# Patient Record
Sex: Female | Born: 1950
Health system: Southern US, Community
[De-identification: ages and names within clinical notes are randomized; demographics above are authoritative.]

## PROBLEM LIST (undated history)

## (undated) DIAGNOSIS — I10 Essential (primary) hypertension: Secondary | ICD-10-CM

## (undated) DIAGNOSIS — Q2543 Congenital aneurysm of aorta: Secondary | ICD-10-CM

## (undated) DIAGNOSIS — I6381 Other cerebral infarction due to occlusion or stenosis of small artery: Secondary | ICD-10-CM

## (undated) DIAGNOSIS — M4800 Spinal stenosis, site unspecified: Secondary | ICD-10-CM

## (undated) DIAGNOSIS — I7121 Aneurysm of the ascending aorta, without rupture: Secondary | ICD-10-CM

## (undated) DIAGNOSIS — G459 Transient cerebral ischemic attack, unspecified: Secondary | ICD-10-CM

## (undated) DIAGNOSIS — E559 Vitamin D deficiency, unspecified: Secondary | ICD-10-CM

## (undated) DIAGNOSIS — E119 Type 2 diabetes mellitus without complications: Secondary | ICD-10-CM

## (undated) DIAGNOSIS — E785 Hyperlipidemia, unspecified: Secondary | ICD-10-CM

## (undated) HISTORY — PX: ABDOMINAL HYSTERECTOMY: SHX81

## (undated) HISTORY — DX: Spinal stenosis, site unspecified: M48.00

## (undated) HISTORY — PX: SPINE SURGERY: SHX786

## (undated) HISTORY — PX: APPENDECTOMY: SHX54

## (undated) HISTORY — DX: Type 2 diabetes mellitus without complications: E11.9

## (undated) HISTORY — DX: Essential (primary) hypertension: I10

## (undated) HISTORY — DX: Vitamin D deficiency, unspecified: E55.9

## (undated) HISTORY — DX: Hyperlipidemia, unspecified: E78.5

## (undated) HISTORY — PX: REPLACEMENT TOTAL KNEE: SUR1224

## (undated) HISTORY — DX: Other cerebral infarction due to occlusion or stenosis of small artery: I63.81

---

## 2014-09-19 DIAGNOSIS — Z0000 Encounter for general adult medical examination without abnormal findings: Secondary | ICD-10-CM | POA: Diagnosis not present

## 2014-09-19 DIAGNOSIS — E1169 Type 2 diabetes mellitus with other specified complication: Secondary | ICD-10-CM | POA: Diagnosis not present

## 2014-09-19 DIAGNOSIS — E782 Mixed hyperlipidemia: Secondary | ICD-10-CM | POA: Diagnosis not present

## 2014-09-25 DIAGNOSIS — M25552 Pain in left hip: Secondary | ICD-10-CM | POA: Diagnosis not present

## 2014-09-25 DIAGNOSIS — M5136 Other intervertebral disc degeneration, lumbar region: Secondary | ICD-10-CM | POA: Diagnosis not present

## 2014-09-25 DIAGNOSIS — M545 Low back pain: Secondary | ICD-10-CM | POA: Diagnosis not present

## 2014-09-25 DIAGNOSIS — E782 Mixed hyperlipidemia: Secondary | ICD-10-CM | POA: Diagnosis not present

## 2014-09-25 DIAGNOSIS — M47816 Spondylosis without myelopathy or radiculopathy, lumbar region: Secondary | ICD-10-CM | POA: Diagnosis not present

## 2014-09-25 DIAGNOSIS — Z1389 Encounter for screening for other disorder: Secondary | ICD-10-CM | POA: Diagnosis not present

## 2014-09-25 DIAGNOSIS — R739 Hyperglycemia, unspecified: Secondary | ICD-10-CM | POA: Diagnosis not present

## 2014-09-25 DIAGNOSIS — Z0000 Encounter for general adult medical examination without abnormal findings: Secondary | ICD-10-CM | POA: Diagnosis not present

## 2014-09-25 DIAGNOSIS — Z23 Encounter for immunization: Secondary | ICD-10-CM | POA: Diagnosis not present

## 2014-09-25 DIAGNOSIS — R911 Solitary pulmonary nodule: Secondary | ICD-10-CM | POA: Diagnosis not present

## 2014-09-25 DIAGNOSIS — M25551 Pain in right hip: Secondary | ICD-10-CM | POA: Diagnosis not present

## 2014-10-02 DIAGNOSIS — K8080 Other cholelithiasis without obstruction: Secondary | ICD-10-CM | POA: Diagnosis not present

## 2014-10-02 DIAGNOSIS — R918 Other nonspecific abnormal finding of lung field: Secondary | ICD-10-CM | POA: Diagnosis not present

## 2014-10-02 DIAGNOSIS — R911 Solitary pulmonary nodule: Secondary | ICD-10-CM | POA: Diagnosis not present

## 2014-10-02 DIAGNOSIS — Z1231 Encounter for screening mammogram for malignant neoplasm of breast: Secondary | ICD-10-CM | POA: Diagnosis not present

## 2014-10-17 DIAGNOSIS — H538 Other visual disturbances: Secondary | ICD-10-CM | POA: Diagnosis not present

## 2014-10-17 DIAGNOSIS — H2513 Age-related nuclear cataract, bilateral: Secondary | ICD-10-CM | POA: Diagnosis not present

## 2014-11-06 DIAGNOSIS — H401131 Primary open-angle glaucoma, bilateral, mild stage: Secondary | ICD-10-CM | POA: Diagnosis not present

## 2014-11-12 ENCOUNTER — Ambulatory Visit (HOSPITAL_COMMUNITY)
Admission: RE | Admit: 2014-11-12 | Discharge: 2014-11-12 | Disposition: A | Discharge status: Home / Self Care | Payer: Medicare Other | Source: Ambulatory Visit | Attending: Ophthalmology | Admitting: Ophthalmology

## 2014-11-12 ENCOUNTER — Encounter (HOSPITAL_COMMUNITY)
Admission: RE | Disposition: A | Discharge status: Home / Self Care | Payer: Self-pay | Source: Ambulatory Visit | Attending: Ophthalmology

## 2014-11-12 ENCOUNTER — Encounter (HOSPITAL_COMMUNITY): Payer: Self-pay | Admitting: *Deleted

## 2014-11-12 DIAGNOSIS — H401111 Primary open-angle glaucoma, right eye, mild stage: Secondary | ICD-10-CM | POA: Diagnosis not present

## 2014-11-12 DIAGNOSIS — H4089 Other specified glaucoma: Secondary | ICD-10-CM | POA: Diagnosis not present

## 2014-11-12 HISTORY — PX: SLT LASER APPLICATION: SHX6099

## 2014-11-12 SURGERY — SLT LASER APPLICATION
Anesthesia: LOCAL | Laterality: Right

## 2014-11-12 MED ORDER — TETRACAINE HCL 0.5 % OP SOLN
1.0000 [drp] | Freq: Once | OPHTHALMIC | Status: AC
  Administered 2014-11-12: 1 [drp] via OPHTHALMIC

## 2014-11-12 MED ORDER — TETRACAINE HCL 0.5 % OP SOLN
OPHTHALMIC | Status: AC
  Filled 2014-11-12: qty 2

## 2014-11-12 MED ORDER — PILOCARPINE HCL 1 % OP SOLN
2.0000 [drp] | Freq: Once | OPHTHALMIC | Status: AC
  Administered 2014-11-12: 2 [drp] via OPHTHALMIC

## 2014-11-12 MED ORDER — PILOCARPINE HCL 1 % OP SOLN
OPHTHALMIC | Status: AC
  Filled 2014-11-12: qty 15

## 2014-11-12 MED ORDER — APRACLONIDINE HCL 1 % OP SOLN
OPHTHALMIC | Status: AC
  Filled 2014-11-12: qty 0.1

## 2014-11-12 MED ORDER — APRACLONIDINE HCL 1 % OP SOLN
1.0000 [drp] | OPHTHALMIC | Status: AC
  Administered 2014-11-12 (×3): 1 [drp] via OPHTHALMIC

## 2014-11-12 NOTE — Brief Op Note (Signed)
Danielle Frey 11/12/2014  Williams Che, MD  Pre-op Diagnosis:  uncontrolled glaucoma  Post-op Diagnosis:  same  Yag laser self-test completed: Yes.    Indications:  Elevated IOP and new glaucomatous Vf changes  Procedure: SLT OD  right eye  Eye protection worn by staff:  Yes.   Laser In Use sign on door:  Yes.    Laser:  LUMENIS YAG/SLT LASER  Power Setting:  0.9 mJ/burst Anatomical site treated:  Trabecular meshwork OD Number of applications:  767 Total energy delivered: 91.8 mJ Results:  Gas bubbles observed  The patient was discharged home with instructions to continue all her current glaucoma medications in the un-operated eye, and discontinue all her current glaucoma medications, if any.  Patient was instructed to go to the office, as previously scheduled, for intraocular pressure:  Yes.    Patient verbalizes understanding of discharge instructions:  Yes.    Notes:  Gomoscopy:  Grade:   4 Open    Pigmentation:  none    Synechiae:  none    Angle ressessions : none    Other:  Pt tolerated procedure well without complications

## 2014-11-12 NOTE — Discharge Instructions (Signed)
Danielle Frey  11/12/2014     Instructions    Activity: No Restrictions.   Diet: Resume Diet you were on at home.   Pain Medication: Tylenol if Needed.   CONTACT YOUR DOCTOR IF YOU HAVE PAIN, REDNESS IN YOUR EYE, OR DECREASED VISION.   Follow-up: 12/11/2014 at 10:45 with Williams Che, MD.   Dr. Iona Hansen: [phone]     If you find that you cannot contact your physician, but feel that your signs and   Symptoms warrant a physician's attention, call the Emergency Room at   [phone] [phone].

## 2014-11-13 ENCOUNTER — Encounter (HOSPITAL_COMMUNITY): Payer: Self-pay | Admitting: Ophthalmology

## 2014-11-19 DIAGNOSIS — H401131 Primary open-angle glaucoma, bilateral, mild stage: Secondary | ICD-10-CM | POA: Diagnosis not present

## 2014-11-26 DIAGNOSIS — H401131 Primary open-angle glaucoma, bilateral, mild stage: Secondary | ICD-10-CM | POA: Diagnosis not present

## 2015-01-10 DIAGNOSIS — E1169 Type 2 diabetes mellitus with other specified complication: Secondary | ICD-10-CM | POA: Diagnosis not present

## 2015-01-10 DIAGNOSIS — E782 Mixed hyperlipidemia: Secondary | ICD-10-CM | POA: Diagnosis not present

## 2015-01-17 DIAGNOSIS — M545 Low back pain: Secondary | ICD-10-CM | POA: Diagnosis not present

## 2015-01-17 DIAGNOSIS — M25551 Pain in right hip: Secondary | ICD-10-CM | POA: Diagnosis not present

## 2015-01-17 DIAGNOSIS — R911 Solitary pulmonary nodule: Secondary | ICD-10-CM | POA: Diagnosis not present

## 2015-01-17 DIAGNOSIS — M25552 Pain in left hip: Secondary | ICD-10-CM | POA: Diagnosis not present

## 2015-01-17 DIAGNOSIS — R739 Hyperglycemia, unspecified: Secondary | ICD-10-CM | POA: Diagnosis not present

## 2015-01-17 DIAGNOSIS — E782 Mixed hyperlipidemia: Secondary | ICD-10-CM | POA: Diagnosis not present

## 2015-01-23 DIAGNOSIS — M4806 Spinal stenosis, lumbar region: Secondary | ICD-10-CM | POA: Diagnosis not present

## 2015-01-23 DIAGNOSIS — M4316 Spondylolisthesis, lumbar region: Secondary | ICD-10-CM | POA: Diagnosis not present

## 2015-01-23 DIAGNOSIS — M47814 Spondylosis without myelopathy or radiculopathy, thoracic region: Secondary | ICD-10-CM | POA: Diagnosis not present

## 2015-01-23 DIAGNOSIS — M5124 Other intervertebral disc displacement, thoracic region: Secondary | ICD-10-CM | POA: Diagnosis not present

## 2015-02-06 DIAGNOSIS — Z96651 Presence of right artificial knee joint: Secondary | ICD-10-CM | POA: Diagnosis not present

## 2015-02-06 DIAGNOSIS — Z87891 Personal history of nicotine dependence: Secondary | ICD-10-CM | POA: Diagnosis not present

## 2015-02-06 DIAGNOSIS — Z90710 Acquired absence of both cervix and uterus: Secondary | ICD-10-CM | POA: Diagnosis not present

## 2015-02-06 DIAGNOSIS — Z13820 Encounter for screening for osteoporosis: Secondary | ICD-10-CM | POA: Diagnosis not present

## 2015-02-06 DIAGNOSIS — E2839 Other primary ovarian failure: Secondary | ICD-10-CM | POA: Diagnosis not present

## 2015-02-06 DIAGNOSIS — Z79899 Other long term (current) drug therapy: Secondary | ICD-10-CM | POA: Diagnosis not present

## 2015-02-06 DIAGNOSIS — M810 Age-related osteoporosis without current pathological fracture: Secondary | ICD-10-CM | POA: Diagnosis not present

## 2015-02-06 DIAGNOSIS — M545 Low back pain: Secondary | ICD-10-CM | POA: Diagnosis not present

## 2015-03-07 DIAGNOSIS — M4317 Spondylolisthesis, lumbosacral region: Secondary | ICD-10-CM | POA: Diagnosis not present

## 2015-03-07 DIAGNOSIS — M47817 Spondylosis without myelopathy or radiculopathy, lumbosacral region: Secondary | ICD-10-CM | POA: Diagnosis not present

## 2015-03-07 DIAGNOSIS — M4806 Spinal stenosis, lumbar region: Secondary | ICD-10-CM | POA: Diagnosis not present

## 2015-03-07 DIAGNOSIS — H401131 Primary open-angle glaucoma, bilateral, mild stage: Secondary | ICD-10-CM | POA: Diagnosis not present

## 2015-03-07 DIAGNOSIS — H2512 Age-related nuclear cataract, left eye: Secondary | ICD-10-CM | POA: Diagnosis not present

## 2015-03-07 DIAGNOSIS — M5136 Other intervertebral disc degeneration, lumbar region: Secondary | ICD-10-CM | POA: Diagnosis not present

## 2015-03-07 DIAGNOSIS — Z961 Presence of intraocular lens: Secondary | ICD-10-CM | POA: Diagnosis not present

## 2015-03-28 DIAGNOSIS — M25551 Pain in right hip: Secondary | ICD-10-CM | POA: Diagnosis not present

## 2015-03-28 DIAGNOSIS — M4806 Spinal stenosis, lumbar region: Secondary | ICD-10-CM | POA: Diagnosis not present

## 2015-03-28 DIAGNOSIS — M25561 Pain in right knee: Secondary | ICD-10-CM | POA: Diagnosis not present

## 2015-03-28 DIAGNOSIS — M25552 Pain in left hip: Secondary | ICD-10-CM | POA: Diagnosis not present

## 2015-03-28 DIAGNOSIS — M5136 Other intervertebral disc degeneration, lumbar region: Secondary | ICD-10-CM | POA: Diagnosis not present

## 2015-03-28 DIAGNOSIS — Z823 Family history of stroke: Secondary | ICD-10-CM | POA: Diagnosis not present

## 2015-03-28 DIAGNOSIS — Z96651 Presence of right artificial knee joint: Secondary | ICD-10-CM | POA: Diagnosis not present

## 2015-03-28 DIAGNOSIS — Z87891 Personal history of nicotine dependence: Secondary | ICD-10-CM | POA: Diagnosis not present

## 2015-03-28 DIAGNOSIS — M47816 Spondylosis without myelopathy or radiculopathy, lumbar region: Secondary | ICD-10-CM | POA: Diagnosis not present

## 2015-03-28 DIAGNOSIS — M150 Primary generalized (osteo)arthritis: Secondary | ICD-10-CM | POA: Diagnosis not present

## 2015-03-28 DIAGNOSIS — Z8249 Family history of ischemic heart disease and other diseases of the circulatory system: Secondary | ICD-10-CM | POA: Diagnosis not present

## 2015-03-28 DIAGNOSIS — M4317 Spondylolisthesis, lumbosacral region: Secondary | ICD-10-CM | POA: Diagnosis not present

## 2015-03-28 DIAGNOSIS — Z809 Family history of malignant neoplasm, unspecified: Secondary | ICD-10-CM | POA: Diagnosis not present

## 2015-03-28 DIAGNOSIS — M47817 Spondylosis without myelopathy or radiculopathy, lumbosacral region: Secondary | ICD-10-CM | POA: Diagnosis not present

## 2015-03-28 DIAGNOSIS — Z6841 Body Mass Index (BMI) 40.0 and over, adult: Secondary | ICD-10-CM | POA: Diagnosis not present

## 2015-03-28 DIAGNOSIS — Z833 Family history of diabetes mellitus: Secondary | ICD-10-CM | POA: Diagnosis not present

## 2015-04-17 DIAGNOSIS — M5136 Other intervertebral disc degeneration, lumbar region: Secondary | ICD-10-CM | POA: Diagnosis not present

## 2015-04-17 DIAGNOSIS — M4317 Spondylolisthesis, lumbosacral region: Secondary | ICD-10-CM | POA: Diagnosis not present

## 2015-04-17 DIAGNOSIS — M150 Primary generalized (osteo)arthritis: Secondary | ICD-10-CM | POA: Diagnosis not present

## 2015-04-17 DIAGNOSIS — M47817 Spondylosis without myelopathy or radiculopathy, lumbosacral region: Secondary | ICD-10-CM | POA: Diagnosis not present

## 2015-06-27 DIAGNOSIS — H401131 Primary open-angle glaucoma, bilateral, mild stage: Secondary | ICD-10-CM | POA: Diagnosis not present

## 2016-01-06 ENCOUNTER — Ambulatory Visit (INDEPENDENT_AMBULATORY_CARE_PROVIDER_SITE_OTHER): Payer: Medicare Other | Admitting: Otolaryngology

## 2016-01-06 DIAGNOSIS — J343 Hypertrophy of nasal turbinates: Secondary | ICD-10-CM

## 2016-01-06 DIAGNOSIS — J310 Chronic rhinitis: Secondary | ICD-10-CM

## 2016-04-16 DIAGNOSIS — K432 Incisional hernia without obstruction or gangrene: Secondary | ICD-10-CM | POA: Diagnosis not present

## 2016-04-27 DIAGNOSIS — K439 Ventral hernia without obstruction or gangrene: Secondary | ICD-10-CM | POA: Diagnosis not present

## 2016-04-27 DIAGNOSIS — Z538 Procedure and treatment not carried out for other reasons: Secondary | ICD-10-CM | POA: Diagnosis not present

## 2016-04-30 DIAGNOSIS — E782 Mixed hyperlipidemia: Secondary | ICD-10-CM | POA: Diagnosis not present

## 2016-04-30 DIAGNOSIS — K429 Umbilical hernia without obstruction or gangrene: Secondary | ICD-10-CM | POA: Diagnosis not present

## 2016-04-30 DIAGNOSIS — E1169 Type 2 diabetes mellitus with other specified complication: Secondary | ICD-10-CM | POA: Diagnosis not present

## 2016-04-30 DIAGNOSIS — Z6841 Body Mass Index (BMI) 40.0 and over, adult: Secondary | ICD-10-CM | POA: Diagnosis not present

## 2016-04-30 DIAGNOSIS — G4733 Obstructive sleep apnea (adult) (pediatric): Secondary | ICD-10-CM | POA: Diagnosis not present

## 2016-05-07 DIAGNOSIS — G4733 Obstructive sleep apnea (adult) (pediatric): Secondary | ICD-10-CM | POA: Diagnosis not present

## 2016-05-15 DIAGNOSIS — Z6841 Body Mass Index (BMI) 40.0 and over, adult: Secondary | ICD-10-CM | POA: Diagnosis not present

## 2016-05-15 DIAGNOSIS — G4733 Obstructive sleep apnea (adult) (pediatric): Secondary | ICD-10-CM | POA: Diagnosis not present

## 2016-05-15 DIAGNOSIS — K429 Umbilical hernia without obstruction or gangrene: Secondary | ICD-10-CM | POA: Diagnosis not present

## 2016-05-15 DIAGNOSIS — E782 Mixed hyperlipidemia: Secondary | ICD-10-CM | POA: Diagnosis not present

## 2016-05-15 DIAGNOSIS — M545 Low back pain: Secondary | ICD-10-CM | POA: Diagnosis not present

## 2016-05-22 DIAGNOSIS — R531 Weakness: Secondary | ICD-10-CM | POA: Diagnosis not present

## 2016-05-22 DIAGNOSIS — M545 Low back pain: Secondary | ICD-10-CM | POA: Diagnosis not present

## 2016-05-22 DIAGNOSIS — M9983 Other biomechanical lesions of lumbar region: Secondary | ICD-10-CM | POA: Diagnosis not present

## 2016-05-22 DIAGNOSIS — M48061 Spinal stenosis, lumbar region without neurogenic claudication: Secondary | ICD-10-CM | POA: Diagnosis not present

## 2016-05-22 DIAGNOSIS — M47816 Spondylosis without myelopathy or radiculopathy, lumbar region: Secondary | ICD-10-CM | POA: Diagnosis not present

## 2016-06-30 DIAGNOSIS — F518 Other sleep disorders not due to a substance or known physiological condition: Secondary | ICD-10-CM | POA: Diagnosis not present

## 2016-06-30 DIAGNOSIS — E785 Hyperlipidemia, unspecified: Secondary | ICD-10-CM | POA: Diagnosis not present

## 2016-06-30 DIAGNOSIS — G4736 Sleep related hypoventilation in conditions classified elsewhere: Secondary | ICD-10-CM | POA: Diagnosis not present

## 2016-06-30 DIAGNOSIS — Z87891 Personal history of nicotine dependence: Secondary | ICD-10-CM | POA: Diagnosis not present

## 2016-09-11 DIAGNOSIS — M47816 Spondylosis without myelopathy or radiculopathy, lumbar region: Secondary | ICD-10-CM | POA: Diagnosis not present

## 2016-09-11 DIAGNOSIS — M545 Low back pain: Secondary | ICD-10-CM | POA: Diagnosis not present

## 2016-09-11 DIAGNOSIS — G894 Chronic pain syndrome: Secondary | ICD-10-CM

## 2016-09-11 DIAGNOSIS — M5136 Other intervertebral disc degeneration, lumbar region: Secondary | ICD-10-CM | POA: Diagnosis not present

## 2016-09-11 DIAGNOSIS — M5459 Other low back pain: Secondary | ICD-10-CM

## 2016-09-11 DIAGNOSIS — M48062 Spinal stenosis, lumbar region with neurogenic claudication: Secondary | ICD-10-CM | POA: Diagnosis not present

## 2016-09-11 DIAGNOSIS — M51369 Other intervertebral disc degeneration, lumbar region without mention of lumbar back pain or lower extremity pain: Secondary | ICD-10-CM

## 2016-09-11 HISTORY — DX: Other low back pain: M54.59

## 2016-09-11 HISTORY — DX: Chronic pain syndrome: G89.4

## 2016-09-11 HISTORY — DX: Other intervertebral disc degeneration, lumbar region without mention of lumbar back pain or lower extremity pain: M51.369

## 2016-09-11 HISTORY — DX: Spondylosis without myelopathy or radiculopathy, lumbar region: M47.816

## 2016-09-11 HISTORY — DX: Spinal stenosis, lumbar region with neurogenic claudication: M48.062

## 2016-09-15 DIAGNOSIS — Z0000 Encounter for general adult medical examination without abnormal findings: Secondary | ICD-10-CM | POA: Diagnosis not present

## 2016-09-15 DIAGNOSIS — Z6841 Body Mass Index (BMI) 40.0 and over, adult: Secondary | ICD-10-CM | POA: Diagnosis not present

## 2016-09-15 DIAGNOSIS — Z720 Tobacco use: Secondary | ICD-10-CM | POA: Diagnosis not present

## 2016-09-15 DIAGNOSIS — E782 Mixed hyperlipidemia: Secondary | ICD-10-CM | POA: Diagnosis not present

## 2016-09-15 DIAGNOSIS — E1169 Type 2 diabetes mellitus with other specified complication: Secondary | ICD-10-CM | POA: Diagnosis not present

## 2016-09-17 DIAGNOSIS — M5136 Other intervertebral disc degeneration, lumbar region: Secondary | ICD-10-CM | POA: Diagnosis not present

## 2016-09-21 DIAGNOSIS — E782 Mixed hyperlipidemia: Secondary | ICD-10-CM | POA: Diagnosis not present

## 2016-09-21 DIAGNOSIS — G4733 Obstructive sleep apnea (adult) (pediatric): Secondary | ICD-10-CM | POA: Diagnosis not present

## 2016-09-21 DIAGNOSIS — M545 Low back pain: Secondary | ICD-10-CM | POA: Diagnosis not present

## 2016-09-21 DIAGNOSIS — Z6841 Body Mass Index (BMI) 40.0 and over, adult: Secondary | ICD-10-CM | POA: Diagnosis not present

## 2016-09-21 DIAGNOSIS — Z23 Encounter for immunization: Secondary | ICD-10-CM | POA: Diagnosis not present

## 2016-09-21 DIAGNOSIS — K429 Umbilical hernia without obstruction or gangrene: Secondary | ICD-10-CM | POA: Diagnosis not present

## 2016-09-23 DIAGNOSIS — H2512 Age-related nuclear cataract, left eye: Secondary | ICD-10-CM | POA: Diagnosis not present

## 2016-09-23 DIAGNOSIS — H40053 Ocular hypertension, bilateral: Secondary | ICD-10-CM | POA: Diagnosis not present

## 2016-09-23 DIAGNOSIS — H40023 Open angle with borderline findings, high risk, bilateral: Secondary | ICD-10-CM | POA: Diagnosis not present

## 2016-09-23 DIAGNOSIS — Z961 Presence of intraocular lens: Secondary | ICD-10-CM | POA: Diagnosis not present

## 2016-09-25 DIAGNOSIS — M6281 Muscle weakness (generalized): Secondary | ICD-10-CM | POA: Diagnosis not present

## 2016-09-25 DIAGNOSIS — M5440 Lumbago with sciatica, unspecified side: Secondary | ICD-10-CM | POA: Diagnosis not present

## 2016-09-25 DIAGNOSIS — R262 Difficulty in walking, not elsewhere classified: Secondary | ICD-10-CM | POA: Diagnosis not present

## 2016-09-25 DIAGNOSIS — M545 Low back pain: Secondary | ICD-10-CM | POA: Diagnosis not present

## 2016-09-29 DIAGNOSIS — M6281 Muscle weakness (generalized): Secondary | ICD-10-CM | POA: Diagnosis not present

## 2016-09-29 DIAGNOSIS — R262 Difficulty in walking, not elsewhere classified: Secondary | ICD-10-CM | POA: Diagnosis not present

## 2016-09-29 DIAGNOSIS — M5440 Lumbago with sciatica, unspecified side: Secondary | ICD-10-CM | POA: Diagnosis not present

## 2016-09-29 DIAGNOSIS — M545 Low back pain: Secondary | ICD-10-CM | POA: Diagnosis not present

## 2016-10-01 DIAGNOSIS — M6281 Muscle weakness (generalized): Secondary | ICD-10-CM | POA: Diagnosis not present

## 2016-10-01 DIAGNOSIS — M545 Low back pain: Secondary | ICD-10-CM | POA: Diagnosis not present

## 2016-10-01 DIAGNOSIS — M5440 Lumbago with sciatica, unspecified side: Secondary | ICD-10-CM | POA: Diagnosis not present

## 2016-10-01 DIAGNOSIS — R262 Difficulty in walking, not elsewhere classified: Secondary | ICD-10-CM | POA: Diagnosis not present

## 2016-10-01 DIAGNOSIS — K420 Umbilical hernia with obstruction, without gangrene: Secondary | ICD-10-CM | POA: Diagnosis not present

## 2016-10-06 DIAGNOSIS — E785 Hyperlipidemia, unspecified: Secondary | ICD-10-CM | POA: Diagnosis not present

## 2016-10-06 DIAGNOSIS — Z8249 Family history of ischemic heart disease and other diseases of the circulatory system: Secondary | ICD-10-CM | POA: Diagnosis not present

## 2016-10-06 DIAGNOSIS — Z79899 Other long term (current) drug therapy: Secondary | ICD-10-CM | POA: Diagnosis not present

## 2016-10-06 DIAGNOSIS — E119 Type 2 diabetes mellitus without complications: Secondary | ICD-10-CM | POA: Diagnosis not present

## 2016-10-06 DIAGNOSIS — G4733 Obstructive sleep apnea (adult) (pediatric): Secondary | ICD-10-CM | POA: Diagnosis not present

## 2016-10-06 DIAGNOSIS — K430 Incisional hernia with obstruction, without gangrene: Secondary | ICD-10-CM | POA: Diagnosis not present

## 2016-10-06 DIAGNOSIS — Z809 Family history of malignant neoplasm, unspecified: Secondary | ICD-10-CM | POA: Diagnosis not present

## 2016-10-06 DIAGNOSIS — R32 Unspecified urinary incontinence: Secondary | ICD-10-CM | POA: Diagnosis not present

## 2016-10-06 DIAGNOSIS — M1990 Unspecified osteoarthritis, unspecified site: Secondary | ICD-10-CM | POA: Diagnosis not present

## 2016-10-06 DIAGNOSIS — Z833 Family history of diabetes mellitus: Secondary | ICD-10-CM | POA: Diagnosis not present

## 2016-10-06 DIAGNOSIS — Z87891 Personal history of nicotine dependence: Secondary | ICD-10-CM | POA: Diagnosis not present

## 2016-10-06 DIAGNOSIS — K420 Umbilical hernia with obstruction, without gangrene: Secondary | ICD-10-CM | POA: Diagnosis not present

## 2016-10-06 DIAGNOSIS — Z6841 Body Mass Index (BMI) 40.0 and over, adult: Secondary | ICD-10-CM | POA: Diagnosis not present

## 2016-10-06 DIAGNOSIS — Z96651 Presence of right artificial knee joint: Secondary | ICD-10-CM | POA: Diagnosis not present

## 2016-10-07 DIAGNOSIS — G4733 Obstructive sleep apnea (adult) (pediatric): Secondary | ICD-10-CM | POA: Diagnosis not present

## 2016-10-07 DIAGNOSIS — K430 Incisional hernia with obstruction, without gangrene: Secondary | ICD-10-CM | POA: Diagnosis not present

## 2016-10-07 DIAGNOSIS — Z6841 Body Mass Index (BMI) 40.0 and over, adult: Secondary | ICD-10-CM | POA: Diagnosis not present

## 2016-10-07 DIAGNOSIS — K420 Umbilical hernia with obstruction, without gangrene: Secondary | ICD-10-CM | POA: Diagnosis not present

## 2016-10-07 DIAGNOSIS — Z87891 Personal history of nicotine dependence: Secondary | ICD-10-CM | POA: Diagnosis not present

## 2016-10-19 DIAGNOSIS — M47816 Spondylosis without myelopathy or radiculopathy, lumbar region: Secondary | ICD-10-CM | POA: Diagnosis not present

## 2016-10-19 DIAGNOSIS — M5136 Other intervertebral disc degeneration, lumbar region: Secondary | ICD-10-CM | POA: Diagnosis not present

## 2016-10-19 DIAGNOSIS — M48062 Spinal stenosis, lumbar region with neurogenic claudication: Secondary | ICD-10-CM | POA: Diagnosis not present

## 2016-10-19 DIAGNOSIS — G894 Chronic pain syndrome: Secondary | ICD-10-CM | POA: Diagnosis not present

## 2016-10-19 HISTORY — DX: Morbid (severe) obesity due to excess calories: E66.01

## 2016-11-13 DIAGNOSIS — E1169 Type 2 diabetes mellitus with other specified complication: Secondary | ICD-10-CM | POA: Diagnosis not present

## 2016-11-13 DIAGNOSIS — Z6841 Body Mass Index (BMI) 40.0 and over, adult: Secondary | ICD-10-CM | POA: Diagnosis not present

## 2016-11-13 DIAGNOSIS — K429 Umbilical hernia without obstruction or gangrene: Secondary | ICD-10-CM | POA: Diagnosis not present

## 2016-11-13 DIAGNOSIS — E782 Mixed hyperlipidemia: Secondary | ICD-10-CM | POA: Diagnosis not present

## 2016-11-13 DIAGNOSIS — G4733 Obstructive sleep apnea (adult) (pediatric): Secondary | ICD-10-CM | POA: Diagnosis not present

## 2016-11-13 DIAGNOSIS — M545 Low back pain: Secondary | ICD-10-CM | POA: Diagnosis not present

## 2016-11-30 DIAGNOSIS — M5136 Other intervertebral disc degeneration, lumbar region: Secondary | ICD-10-CM | POA: Diagnosis not present

## 2016-11-30 DIAGNOSIS — M47816 Spondylosis without myelopathy or radiculopathy, lumbar region: Secondary | ICD-10-CM | POA: Diagnosis not present

## 2016-11-30 DIAGNOSIS — M545 Low back pain: Secondary | ICD-10-CM | POA: Diagnosis not present

## 2016-11-30 DIAGNOSIS — G894 Chronic pain syndrome: Secondary | ICD-10-CM | POA: Diagnosis not present

## 2016-11-30 DIAGNOSIS — M48062 Spinal stenosis, lumbar region with neurogenic claudication: Secondary | ICD-10-CM | POA: Diagnosis not present

## 2016-12-15 DIAGNOSIS — Z79899 Other long term (current) drug therapy: Secondary | ICD-10-CM | POA: Diagnosis not present

## 2016-12-15 DIAGNOSIS — G4733 Obstructive sleep apnea (adult) (pediatric): Secondary | ICD-10-CM | POA: Diagnosis not present

## 2016-12-15 DIAGNOSIS — M47816 Spondylosis without myelopathy or radiculopathy, lumbar region: Secondary | ICD-10-CM | POA: Diagnosis not present

## 2016-12-15 DIAGNOSIS — M5136 Other intervertebral disc degeneration, lumbar region: Secondary | ICD-10-CM | POA: Diagnosis not present

## 2017-01-20 DIAGNOSIS — M5136 Other intervertebral disc degeneration, lumbar region: Secondary | ICD-10-CM | POA: Diagnosis not present

## 2017-01-20 DIAGNOSIS — G894 Chronic pain syndrome: Secondary | ICD-10-CM | POA: Diagnosis not present

## 2017-01-20 DIAGNOSIS — M545 Low back pain: Secondary | ICD-10-CM | POA: Diagnosis not present

## 2017-01-20 DIAGNOSIS — M48062 Spinal stenosis, lumbar region with neurogenic claudication: Secondary | ICD-10-CM | POA: Diagnosis not present

## 2017-02-10 DIAGNOSIS — J0101 Acute recurrent maxillary sinusitis: Secondary | ICD-10-CM | POA: Diagnosis not present

## 2017-02-10 DIAGNOSIS — E1169 Type 2 diabetes mellitus with other specified complication: Secondary | ICD-10-CM | POA: Diagnosis not present

## 2017-02-10 DIAGNOSIS — E782 Mixed hyperlipidemia: Secondary | ICD-10-CM | POA: Diagnosis not present

## 2017-02-10 DIAGNOSIS — Z6841 Body Mass Index (BMI) 40.0 and over, adult: Secondary | ICD-10-CM | POA: Diagnosis not present

## 2017-02-10 DIAGNOSIS — M545 Low back pain: Secondary | ICD-10-CM | POA: Diagnosis not present

## 2017-02-10 DIAGNOSIS — G4733 Obstructive sleep apnea (adult) (pediatric): Secondary | ICD-10-CM | POA: Diagnosis not present

## 2017-06-15 DIAGNOSIS — E782 Mixed hyperlipidemia: Secondary | ICD-10-CM | POA: Diagnosis not present

## 2017-06-15 DIAGNOSIS — M545 Low back pain: Secondary | ICD-10-CM | POA: Diagnosis not present

## 2017-06-15 DIAGNOSIS — E1169 Type 2 diabetes mellitus with other specified complication: Secondary | ICD-10-CM | POA: Diagnosis not present

## 2017-06-15 DIAGNOSIS — Z681 Body mass index (BMI) 19 or less, adult: Secondary | ICD-10-CM | POA: Diagnosis not present

## 2017-06-15 DIAGNOSIS — Z6841 Body Mass Index (BMI) 40.0 and over, adult: Secondary | ICD-10-CM | POA: Diagnosis not present

## 2017-06-15 DIAGNOSIS — R739 Hyperglycemia, unspecified: Secondary | ICD-10-CM | POA: Diagnosis not present

## 2017-06-15 DIAGNOSIS — L408 Other psoriasis: Secondary | ICD-10-CM | POA: Diagnosis not present

## 2017-06-15 DIAGNOSIS — G4733 Obstructive sleep apnea (adult) (pediatric): Secondary | ICD-10-CM | POA: Diagnosis not present

## 2017-08-31 DIAGNOSIS — H40023 Open angle with borderline findings, high risk, bilateral: Secondary | ICD-10-CM | POA: Diagnosis not present

## 2017-09-02 DIAGNOSIS — H401111 Primary open-angle glaucoma, right eye, mild stage: Secondary | ICD-10-CM | POA: Diagnosis not present

## 2017-09-02 DIAGNOSIS — Z961 Presence of intraocular lens: Secondary | ICD-10-CM | POA: Diagnosis not present

## 2017-09-02 DIAGNOSIS — H40053 Ocular hypertension, bilateral: Secondary | ICD-10-CM | POA: Diagnosis not present

## 2017-09-02 DIAGNOSIS — H40022 Open angle with borderline findings, high risk, left eye: Secondary | ICD-10-CM | POA: Diagnosis not present

## 2017-09-28 DIAGNOSIS — H401111 Primary open-angle glaucoma, right eye, mild stage: Secondary | ICD-10-CM | POA: Diagnosis not present

## 2017-09-28 DIAGNOSIS — H40053 Ocular hypertension, bilateral: Secondary | ICD-10-CM | POA: Diagnosis not present

## 2017-10-12 DIAGNOSIS — H401111 Primary open-angle glaucoma, right eye, mild stage: Secondary | ICD-10-CM | POA: Diagnosis not present

## 2017-10-12 DIAGNOSIS — H2512 Age-related nuclear cataract, left eye: Secondary | ICD-10-CM | POA: Diagnosis not present

## 2017-10-12 DIAGNOSIS — Z961 Presence of intraocular lens: Secondary | ICD-10-CM | POA: Diagnosis not present

## 2017-10-12 DIAGNOSIS — H25012 Cortical age-related cataract, left eye: Secondary | ICD-10-CM | POA: Diagnosis not present

## 2017-10-12 DIAGNOSIS — H40022 Open angle with borderline findings, high risk, left eye: Secondary | ICD-10-CM | POA: Diagnosis not present

## 2017-10-14 DIAGNOSIS — L408 Other psoriasis: Secondary | ICD-10-CM | POA: Diagnosis not present

## 2017-10-14 DIAGNOSIS — G4733 Obstructive sleep apnea (adult) (pediatric): Secondary | ICD-10-CM | POA: Diagnosis not present

## 2017-10-14 DIAGNOSIS — M545 Low back pain: Secondary | ICD-10-CM | POA: Diagnosis not present

## 2017-10-14 DIAGNOSIS — E1169 Type 2 diabetes mellitus with other specified complication: Secondary | ICD-10-CM | POA: Diagnosis not present

## 2017-10-14 DIAGNOSIS — Z6838 Body mass index (BMI) 38.0-38.9, adult: Secondary | ICD-10-CM | POA: Diagnosis not present

## 2017-10-14 DIAGNOSIS — E782 Mixed hyperlipidemia: Secondary | ICD-10-CM | POA: Diagnosis not present

## 2017-10-14 DIAGNOSIS — Z0000 Encounter for general adult medical examination without abnormal findings: Secondary | ICD-10-CM | POA: Diagnosis not present

## 2017-10-14 DIAGNOSIS — R739 Hyperglycemia, unspecified: Secondary | ICD-10-CM | POA: Diagnosis not present

## 2017-10-14 DIAGNOSIS — R350 Frequency of micturition: Secondary | ICD-10-CM | POA: Diagnosis not present

## 2017-10-20 DIAGNOSIS — H25812 Combined forms of age-related cataract, left eye: Secondary | ICD-10-CM | POA: Diagnosis not present

## 2017-10-20 DIAGNOSIS — H2512 Age-related nuclear cataract, left eye: Secondary | ICD-10-CM | POA: Diagnosis not present

## 2017-10-21 DIAGNOSIS — Z1231 Encounter for screening mammogram for malignant neoplasm of breast: Secondary | ICD-10-CM | POA: Diagnosis not present

## 2018-03-21 DIAGNOSIS — H401111 Primary open-angle glaucoma, right eye, mild stage: Secondary | ICD-10-CM | POA: Diagnosis not present

## 2018-03-21 DIAGNOSIS — H40022 Open angle with borderline findings, high risk, left eye: Secondary | ICD-10-CM | POA: Diagnosis not present

## 2018-04-12 DIAGNOSIS — L408 Other psoriasis: Secondary | ICD-10-CM | POA: Diagnosis not present

## 2018-04-12 DIAGNOSIS — E782 Mixed hyperlipidemia: Secondary | ICD-10-CM | POA: Diagnosis not present

## 2018-04-12 DIAGNOSIS — R739 Hyperglycemia, unspecified: Secondary | ICD-10-CM | POA: Diagnosis not present

## 2018-04-12 DIAGNOSIS — G4733 Obstructive sleep apnea (adult) (pediatric): Secondary | ICD-10-CM | POA: Diagnosis not present

## 2018-04-12 DIAGNOSIS — M545 Low back pain: Secondary | ICD-10-CM | POA: Diagnosis not present

## 2018-04-12 DIAGNOSIS — R350 Frequency of micturition: Secondary | ICD-10-CM | POA: Diagnosis not present

## 2018-04-12 DIAGNOSIS — Z6836 Body mass index (BMI) 36.0-36.9, adult: Secondary | ICD-10-CM | POA: Diagnosis not present

## 2018-04-12 DIAGNOSIS — E1169 Type 2 diabetes mellitus with other specified complication: Secondary | ICD-10-CM | POA: Diagnosis not present

## 2019-06-08 DIAGNOSIS — H524 Presbyopia: Secondary | ICD-10-CM | POA: Diagnosis not present

## 2019-06-08 DIAGNOSIS — H40022 Open angle with borderline findings, high risk, left eye: Secondary | ICD-10-CM | POA: Diagnosis not present

## 2019-06-08 DIAGNOSIS — H35363 Drusen (degenerative) of macula, bilateral: Secondary | ICD-10-CM | POA: Diagnosis not present

## 2019-06-08 DIAGNOSIS — H401111 Primary open-angle glaucoma, right eye, mild stage: Secondary | ICD-10-CM | POA: Diagnosis not present

## 2019-10-16 DIAGNOSIS — L0100 Impetigo, unspecified: Secondary | ICD-10-CM | POA: Diagnosis not present

## 2019-10-16 DIAGNOSIS — L821 Other seborrheic keratosis: Secondary | ICD-10-CM | POA: Diagnosis not present

## 2019-10-16 DIAGNOSIS — D224 Melanocytic nevi of scalp and neck: Secondary | ICD-10-CM | POA: Diagnosis not present

## 2019-10-16 DIAGNOSIS — L814 Other melanin hyperpigmentation: Secondary | ICD-10-CM | POA: Diagnosis not present

## 2019-10-16 DIAGNOSIS — D225 Melanocytic nevi of trunk: Secondary | ICD-10-CM | POA: Diagnosis not present

## 2019-10-16 DIAGNOSIS — D485 Neoplasm of uncertain behavior of skin: Secondary | ICD-10-CM | POA: Diagnosis not present

## 2019-10-16 DIAGNOSIS — L900 Lichen sclerosus et atrophicus: Secondary | ICD-10-CM | POA: Diagnosis not present

## 2019-10-26 DIAGNOSIS — L409 Psoriasis, unspecified: Secondary | ICD-10-CM | POA: Diagnosis not present

## 2019-10-26 DIAGNOSIS — L814 Other melanin hyperpigmentation: Secondary | ICD-10-CM | POA: Diagnosis not present

## 2019-10-26 DIAGNOSIS — L309 Dermatitis, unspecified: Secondary | ICD-10-CM | POA: Diagnosis not present

## 2020-02-05 DIAGNOSIS — L280 Lichen simplex chronicus: Secondary | ICD-10-CM | POA: Diagnosis not present

## 2020-03-19 DIAGNOSIS — G4733 Obstructive sleep apnea (adult) (pediatric): Secondary | ICD-10-CM | POA: Diagnosis not present

## 2020-03-19 DIAGNOSIS — L408 Other psoriasis: Secondary | ICD-10-CM | POA: Diagnosis not present

## 2020-03-19 DIAGNOSIS — E7849 Other hyperlipidemia: Secondary | ICD-10-CM | POA: Diagnosis not present

## 2020-03-19 DIAGNOSIS — Z6841 Body Mass Index (BMI) 40.0 and over, adult: Secondary | ICD-10-CM | POA: Diagnosis not present

## 2020-03-19 DIAGNOSIS — R739 Hyperglycemia, unspecified: Secondary | ICD-10-CM | POA: Diagnosis not present

## 2020-03-19 DIAGNOSIS — E1169 Type 2 diabetes mellitus with other specified complication: Secondary | ICD-10-CM | POA: Diagnosis not present

## 2020-03-19 DIAGNOSIS — E782 Mixed hyperlipidemia: Secondary | ICD-10-CM | POA: Diagnosis not present

## 2020-03-19 DIAGNOSIS — M25511 Pain in right shoulder: Secondary | ICD-10-CM | POA: Diagnosis not present

## 2020-03-29 DIAGNOSIS — M545 Low back pain, unspecified: Secondary | ICD-10-CM | POA: Diagnosis not present

## 2020-03-29 DIAGNOSIS — M48061 Spinal stenosis, lumbar region without neurogenic claudication: Secondary | ICD-10-CM | POA: Diagnosis not present

## 2020-03-29 DIAGNOSIS — M5126 Other intervertebral disc displacement, lumbar region: Secondary | ICD-10-CM | POA: Diagnosis not present

## 2020-03-29 DIAGNOSIS — M47816 Spondylosis without myelopathy or radiculopathy, lumbar region: Secondary | ICD-10-CM | POA: Diagnosis not present

## 2020-04-25 DIAGNOSIS — G894 Chronic pain syndrome: Secondary | ICD-10-CM | POA: Diagnosis not present

## 2020-04-25 DIAGNOSIS — M48062 Spinal stenosis, lumbar region with neurogenic claudication: Secondary | ICD-10-CM | POA: Diagnosis not present

## 2020-04-25 DIAGNOSIS — M47816 Spondylosis without myelopathy or radiculopathy, lumbar region: Secondary | ICD-10-CM | POA: Diagnosis not present

## 2020-05-09 DIAGNOSIS — M5136 Other intervertebral disc degeneration, lumbar region: Secondary | ICD-10-CM | POA: Diagnosis not present

## 2020-05-09 DIAGNOSIS — M48062 Spinal stenosis, lumbar region with neurogenic claudication: Secondary | ICD-10-CM | POA: Diagnosis not present

## 2020-05-09 DIAGNOSIS — M4726 Other spondylosis with radiculopathy, lumbar region: Secondary | ICD-10-CM | POA: Diagnosis not present

## 2020-05-24 DIAGNOSIS — I8002 Phlebitis and thrombophlebitis of superficial vessels of left lower extremity: Secondary | ICD-10-CM | POA: Diagnosis not present

## 2020-05-24 DIAGNOSIS — Z6839 Body mass index (BMI) 39.0-39.9, adult: Secondary | ICD-10-CM | POA: Diagnosis not present

## 2020-05-24 DIAGNOSIS — R238 Other skin changes: Secondary | ICD-10-CM | POA: Diagnosis not present

## 2020-05-24 DIAGNOSIS — M545 Low back pain, unspecified: Secondary | ICD-10-CM | POA: Diagnosis not present

## 2020-06-04 DIAGNOSIS — D485 Neoplasm of uncertain behavior of skin: Secondary | ICD-10-CM | POA: Diagnosis not present

## 2020-06-04 DIAGNOSIS — L280 Lichen simplex chronicus: Secondary | ICD-10-CM | POA: Diagnosis not present

## 2020-06-04 DIAGNOSIS — L821 Other seborrheic keratosis: Secondary | ICD-10-CM | POA: Diagnosis not present

## 2020-06-20 DIAGNOSIS — M47816 Spondylosis without myelopathy or radiculopathy, lumbar region: Secondary | ICD-10-CM | POA: Diagnosis not present

## 2020-06-20 DIAGNOSIS — M5136 Other intervertebral disc degeneration, lumbar region: Secondary | ICD-10-CM | POA: Diagnosis not present

## 2020-06-20 DIAGNOSIS — M5459 Other low back pain: Secondary | ICD-10-CM | POA: Diagnosis not present

## 2020-06-20 DIAGNOSIS — M48061 Spinal stenosis, lumbar region without neurogenic claudication: Secondary | ICD-10-CM | POA: Diagnosis not present

## 2020-12-09 ENCOUNTER — Other Ambulatory Visit: Payer: Self-pay | Admitting: Family Medicine

## 2020-12-09 DIAGNOSIS — Z1231 Encounter for screening mammogram for malignant neoplasm of breast: Secondary | ICD-10-CM

## 2020-12-11 DIAGNOSIS — M545 Low back pain, unspecified: Secondary | ICD-10-CM | POA: Diagnosis not present

## 2020-12-11 DIAGNOSIS — E7849 Other hyperlipidemia: Secondary | ICD-10-CM | POA: Diagnosis not present

## 2020-12-11 DIAGNOSIS — E782 Mixed hyperlipidemia: Secondary | ICD-10-CM | POA: Diagnosis not present

## 2020-12-11 DIAGNOSIS — R413 Other amnesia: Secondary | ICD-10-CM | POA: Diagnosis not present

## 2020-12-11 DIAGNOSIS — R739 Hyperglycemia, unspecified: Secondary | ICD-10-CM | POA: Diagnosis not present

## 2020-12-11 DIAGNOSIS — Z6841 Body Mass Index (BMI) 40.0 and over, adult: Secondary | ICD-10-CM | POA: Diagnosis not present

## 2020-12-11 DIAGNOSIS — R270 Ataxia, unspecified: Secondary | ICD-10-CM | POA: Diagnosis not present

## 2020-12-24 DIAGNOSIS — R296 Repeated falls: Secondary | ICD-10-CM | POA: Diagnosis not present

## 2020-12-24 DIAGNOSIS — I6782 Cerebral ischemia: Secondary | ICD-10-CM | POA: Diagnosis not present

## 2020-12-24 DIAGNOSIS — I6381 Other cerebral infarction due to occlusion or stenosis of small artery: Secondary | ICD-10-CM | POA: Diagnosis not present

## 2020-12-24 DIAGNOSIS — G319 Degenerative disease of nervous system, unspecified: Secondary | ICD-10-CM | POA: Diagnosis not present

## 2020-12-24 DIAGNOSIS — R413 Other amnesia: Secondary | ICD-10-CM | POA: Diagnosis not present

## 2020-12-24 DIAGNOSIS — R270 Ataxia, unspecified: Secondary | ICD-10-CM | POA: Diagnosis not present

## 2021-01-31 DIAGNOSIS — R413 Other amnesia: Secondary | ICD-10-CM | POA: Diagnosis not present

## 2021-01-31 DIAGNOSIS — Z8673 Personal history of transient ischemic attack (TIA), and cerebral infarction without residual deficits: Secondary | ICD-10-CM | POA: Diagnosis not present

## 2021-01-31 DIAGNOSIS — E7849 Other hyperlipidemia: Secondary | ICD-10-CM | POA: Diagnosis not present

## 2021-01-31 DIAGNOSIS — Z6841 Body Mass Index (BMI) 40.0 and over, adult: Secondary | ICD-10-CM | POA: Diagnosis not present

## 2021-02-07 DIAGNOSIS — Z8673 Personal history of transient ischemic attack (TIA), and cerebral infarction without residual deficits: Secondary | ICD-10-CM | POA: Diagnosis not present

## 2021-02-07 DIAGNOSIS — G459 Transient cerebral ischemic attack, unspecified: Secondary | ICD-10-CM | POA: Diagnosis not present

## 2021-02-07 DIAGNOSIS — I6523 Occlusion and stenosis of bilateral carotid arteries: Secondary | ICD-10-CM | POA: Diagnosis not present

## 2021-02-20 ENCOUNTER — Ambulatory Visit: Payer: PPO | Admitting: Cardiology

## 2021-02-20 ENCOUNTER — Encounter: Payer: Self-pay | Admitting: *Deleted

## 2021-02-20 VITALS — BP 130/80 | HR 81 | Ht 66.0 in | Wt 263.4 lb

## 2021-02-20 DIAGNOSIS — I7121 Aneurysm of the ascending aorta, without rupture: Secondary | ICD-10-CM | POA: Diagnosis not present

## 2021-02-20 DIAGNOSIS — I77819 Aortic ectasia, unspecified site: Secondary | ICD-10-CM

## 2021-02-20 DIAGNOSIS — Q2543 Congenital aneurysm of aorta: Secondary | ICD-10-CM

## 2021-02-20 NOTE — Patient Instructions (Addendum)
Medication Instructions:  Your physician recommends that you continue on your current medications as directed. Please refer to the Current Medication list given to you today.  Labwork: BMET today or tomorrow at [facility] Non-fasting No appointment needed  Testing/Procedures: CTA chest & aorta  Follow-Up: Your physician recommends that you schedule a follow-up appointment in: pending  Any Other Special Instructions Will Be Listed Below (If Applicable).  If you need a refill on your cardiac medications before your next appointment, please call your pharmacy.

## 2021-02-20 NOTE — Progress Notes (Signed)
Clinical Summary Ms. Hamor is a 71 y.o.female seen today as a new consult, referred by PA Kayleen Memos for the following medical problems.   1.Aortic aneurysm - Jan 2023 echo [facility]: LVEF 72-53%, grade I diastolic dysfunction. Mod LAE, ascending aorta 4.2 cm.    2. History of TIA - she is on plavix and atorvastin     Past Medical History:  Diagnosis Date   Diabetes ([place])    Hyperlipidemia    Spinal stenosis      Not on File   No current outpatient medications on file.   No current facility-administered medications for this visit.     Past Surgical History:  Procedure Laterality Date   SLT LASER APPLICATION Right 66/44/0347   Procedure: SLT LASER APPLICATION;  Surgeon: Williams Che, MD;  Location: [facility];  Service: Ophthalmology;  Laterality: Right;     Not on File    No family history on file.   Social History Ms. Eoff reports that she has been smoking. She has a 25.00 pack-year smoking history. She has quit using smokeless tobacco. Ms. Chastang has no history on file for alcohol use.   Review of Systems CONSTITUTIONAL: No weight loss, fever, chills, weakness or fatigue.  HEENT: Eyes: No visual loss, blurred vision, double vision or yellow sclerae.No hearing loss, sneezing, congestion, runny nose or sore throat.  SKIN: No rash or itching.  CARDIOVASCULAR: per hpi RESPIRATORY: No shortness of breath, cough or sputum.  GASTROINTESTINAL: No anorexia, nausea, vomiting or diarrhea. No abdominal pain or blood.  GENITOURINARY: No burning on urination, no polyuria NEUROLOGICAL: No headache, dizziness, syncope, paralysis, ataxia, numbness or tingling in the extremities. No change in bowel or bladder control.  MUSCULOSKELETAL: No muscle, back pain, joint pain or stiffness.  LYMPHATICS: No enlarged nodes. No history of splenectomy.  PSYCHIATRIC: No history of depression or anxiety.  ENDOCRINOLOGIC: No reports of sweating, cold or heat intolerance. No polyuria or  polydipsia.  Marland Kitchen   Physical Examination Today's Vitals   02/20/21 1309  BP: (!) 162/100  Pulse: 81  SpO2: 95%  Weight: 263 lb 6.4 oz (119.5 kg)  Height: 5\' 6"  (1.676 m)   Body mass index is 42.51 kg/m.  Gen: resting comfortably, no acute distress HEENT: no scleral icterus, pupils equal round and reactive, no palptable cervical adenopathy,  CV: RRR, no m/r/g, no jvd Resp: Clear to auscultation bilaterally GI: abdomen is soft, non-tender, non-distended, normal bowel sounds, no hepatosplenomegaly MSK: extremities are warm, no edema.  Skin: warm, no rash Neuro:  no focal deficits Psych: appropriate affect   Diagnostic Studies  Jan 2023 echo Summary    1. The left ventricle is normal in size with mildly increased wall  thickness.    2. The left ventricular systolic function is normal, LVEF is visually  estimated at 60-65%.    3. There is grade I diastolic dysfunction (impaired relaxation).    4. The left atrium is moderately dilated in size.    5. The right ventricle is normal in size, with normal systolic function.    6. Ascending aorta is dilated measuring 4.2 cm as seen.    Jan 2023 carotid US IMPRESSION:  Minimal to moderate amount of bilateral atherosclerotic plaque, left  greater than right, not resulting in a hemodynamically significant  stenosis within either internal carotid artery.    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

## 2021-02-27 ENCOUNTER — Ambulatory Visit (HOSPITAL_BASED_OUTPATIENT_CLINIC_OR_DEPARTMENT_OTHER): Payer: PPO

## 2021-03-03 ENCOUNTER — Encounter: Payer: Self-pay | Admitting: *Deleted

## 2021-03-03 ENCOUNTER — Telehealth: Payer: Self-pay | Admitting: *Deleted

## 2021-03-03 NOTE — Telephone Encounter (Signed)
Laurine Blazer, LPN  08/30/7306  5:69 PM EST Back to Top    Patient notified via letter.  Copy to pcp.

## 2021-03-03 NOTE — Telephone Encounter (Signed)
-----   Message from Arnoldo Lenis, MD sent at 03/02/2021  5:24 PM EST ----- Normal labs  Zandra Abts MD

## 2021-05-01 DIAGNOSIS — Z8673 Personal history of transient ischemic attack (TIA), and cerebral infarction without residual deficits: Secondary | ICD-10-CM | POA: Diagnosis not present

## 2021-05-01 DIAGNOSIS — R413 Other amnesia: Secondary | ICD-10-CM | POA: Diagnosis not present

## 2021-05-01 DIAGNOSIS — F17210 Nicotine dependence, cigarettes, uncomplicated: Secondary | ICD-10-CM | POA: Diagnosis not present

## 2021-05-01 DIAGNOSIS — R350 Frequency of micturition: Secondary | ICD-10-CM | POA: Diagnosis not present

## 2021-05-01 DIAGNOSIS — Z6841 Body Mass Index (BMI) 40.0 and over, adult: Secondary | ICD-10-CM | POA: Diagnosis not present

## 2021-05-01 DIAGNOSIS — R5383 Other fatigue: Secondary | ICD-10-CM | POA: Diagnosis not present

## 2021-05-01 DIAGNOSIS — E7849 Other hyperlipidemia: Secondary | ICD-10-CM | POA: Diagnosis not present

## 2021-05-01 DIAGNOSIS — I10 Essential (primary) hypertension: Secondary | ICD-10-CM | POA: Diagnosis not present

## 2021-05-01 DIAGNOSIS — E782 Mixed hyperlipidemia: Secondary | ICD-10-CM | POA: Diagnosis not present

## 2021-05-07 ENCOUNTER — Encounter: Payer: Self-pay | Admitting: Physician Assistant

## 2021-05-13 ENCOUNTER — Other Ambulatory Visit (INDEPENDENT_AMBULATORY_CARE_PROVIDER_SITE_OTHER): Payer: PPO

## 2021-05-13 ENCOUNTER — Encounter: Payer: Self-pay | Admitting: Physician Assistant

## 2021-05-13 ENCOUNTER — Ambulatory Visit: Payer: PPO | Admitting: Physician Assistant

## 2021-05-13 ENCOUNTER — Encounter: Payer: Self-pay | Admitting: Psychology

## 2021-05-13 VITALS — BP 160/90 | HR 76 | Resp 18 | Ht 66.0 in | Wt 270.0 lb

## 2021-05-13 DIAGNOSIS — G3184 Mild cognitive impairment, so stated: Secondary | ICD-10-CM

## 2021-05-13 DIAGNOSIS — I6381 Other cerebral infarction due to occlusion or stenosis of small artery: Secondary | ICD-10-CM

## 2021-05-13 DIAGNOSIS — R413 Other amnesia: Secondary | ICD-10-CM

## 2021-05-13 LAB — VITAMIN B12: Vitamin B-12: 408 pg/mL (ref 211–911)

## 2021-05-13 LAB — TSH: TSH: 2.75 u[IU]/mL (ref 0.35–5.50)

## 2021-05-13 NOTE — Progress Notes (Signed)
? ? ?Assessment/Plan:  ? ?Danielle Frey is a very pleasant 71 y.o. year old RH female with  a history of hypertension, hyperlipidemia, vitamin D deficiency, history of lacunar stroke, ascending aortic aneurysm 4.2 cm, seen today for evaluation of memory loss. MoCA today is 24/30, with main deficiencies in delayed recall 1/5.  Visuospatial executive 5 out of 5.  Findings are suspicious for mild cognitive impairment, likely due to Alzheimer's disease. ? ? Recommendations:  ? 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 ?MRI brain with/without contrast to assess for underlying structural abnormality and assess vascular load  ?Neurocognitive testing to further evaluate cognitive concerns and determine other underlying cause of memory changes, including potential contribution from sleep, anxiety, or depression  ?Check B12 and TSH ?Agree with vitamin D replenishment ?Continue Aricept 5 mg daily as per PCP ?Patient is to discuss with PCP sleep issues, may need sleep study, she prefers to start melatonin at this time. ?Discussed safety both in and out of the home.  ?Discussed the importance of regular daily schedule with inclusion of crossword puzzles to maintain brain function.  ?Continue to monitor mood with PCP.  ?Stay active at least 30 minutes at least 3 times a week.  ?Naps should be scheduled and should be no longer than 60 minutes and should not occur after 2 PM.  ?Control cardiovascular risk factors  ?Mediterranean diet is recommended  ?Folllow up in 1 month ? ?Subjective:  ? ? ?The patient is seen in neurologic consultation at the request of Lanelle Bal, PA-C for the evaluation of memory.  The patient is here alone.   ?This is a very pleasant 71 y.o. year old RH  female who has had memory issues for about 1 year, when she began to experience problems with short-term memory.  Symptoms were worse about 6 or 7 months ago, when she was doing yard work, and could not "put  things together, and he did not know where I was or where to go which was very frightening ".  MRI at the time, revealed a tiny lacunar stroke, although she did not have any other organic symptoms such as one-sided weakness, numbness or tingling or vision changes.  She is on Plavix and statin.  She says that since that time, she has to write a list of things to do otherwise she will forget.  She does repeat herself quite often.  She denies having any more episodes of disorientation.  She ambulates with some balance difficulty, and uses a cane if she is on uneven ground.  She denies any head injuries.  No wandering behavior.  She allows her husband to drive for her.  Her mood is good "I am always upbeat type of person".  Denies depression, anxiety or irritability.  She sleeps only 2 hours a night, "I have always been a night owl, I never took anything because I do not like taking medications ".  She denies any vivid dreams, or sleepwalking, hallucinations, paranoia, hygiene concerns.  She is independent of bathing and dressing.  Her medications are in a pillbox, but she has still living in a place where she can see them otherwise she will forget.  She is still good with finances.  She denies missing any bills.  Her appetite is increased, she denies any trouble swallowing.  She cooks, and enjoys baking, denies leaving the stove or the faucet on.  She denies any headaches.  She denies tremors, or anosmia.  No history of seizures.  Denies urine incontinence, retention, constipation or diarrhea.  Denies a history of sleep apnea, alcohol or tobacco.  She has family history of dementia in paternal grandfather, maternal grandmother. ? ? ? ?No Known Allergies ? ?Current Outpatient Medications  ?Medication Instructions  ? atorvastatin (LIPITOR) 40 mg, Oral, Daily  ? cetirizine (ZYRTEC) 10 mg, Oral, Daily  ? clopidogrel (PLAVIX) 75 mg, Oral, Daily  ? donepezil (ARICEPT) 5 mg, Oral, Daily at bedtime  ? naproxen sodium (ALEVE)  220 MG tablet Oral  ? vitamin B-12 (CYANOCOBALAMIN) 1,000 mcg, Oral, Daily  ? ? ? ?VITALS:   ?Vitals:  ? 05/13/21 1329  ?BP: (!) 160/90  ?Pulse: 76  ?Resp: 18  ?SpO2: 95%  ?Weight: 270 lb (122.5 kg)  ?Height: '5\' 6"'$  (1.676 m)  ? ?   ? View : No data to display.  ?  ?  ?  ? ? ?PHYSICAL EXAM  ? ?HEENT:  Normocephalic, atraumatic. The mucous membranes are moist. The superficial temporal arteries are without ropiness or tenderness. ?Cardiovascular: Regular rate and rhythm. ?Lungs: Clear to auscultation bilaterally. ?Neck: There are no carotid bruits noted bilaterally. ? ?NEUROLOGICAL: ?   ? View : No data to display.  ?  ?  ?  ?  ?   ? View : No data to display.  ?  ?  ?  ?  ? ?Orientation:  Alert and oriented to person, place and time. No aphasia or dysarthria. Fund of knowledge is appropriate. Recent memory impaired and remote memory intact.  Attention and concentration are normal.  Able to name objects and repeat phrases. Delayed recall  1/5  ?Cranial nerves: There is good facial symmetry. Extraocular muscles are intact and visual fields are full to confrontational testing. Speech is fluent and clear. Soft palate rises symmetrically and there is no tongue deviation. Hearing is intact to conversational tone. ?Tone: Tone is good throughout. ?Sensation: Sensation is intact to light touch and pinprick throughout. Vibration is intact at the bilateral big toe.There is no extinction with double simultaneous stimulation. There is no sensory dermatomal level identified. ?Coordination: The patient has no difficulty with RAM's or FNF bilaterally. Normal finger to nose  ?Motor: Strength is 5/5 in the bilateral upper and lower extremities. There is no pronator drift. There are no fasciculations noted. ?DTR's: Deep tendon reflexes are 1/4 at the bilateral biceps, triceps, brachioradialis, patella and achilles.  Plantar responses are downgoing bilaterally. ?Gait and Station: The patient is able to ambulate without difficulty with a  cane.The patient is able to heel toe walk and in a tandem fashion. The patient is able to stand in the Romberg position. ?  ? ? ?Thank you for allowing [place] the opportunity to participate in the care of this nice patient. Please do not hesitate to contact us for any questions or concerns.  ? ?Total time spent on today's visit was 60 minutes, including both face-to-face time and nonface-to-face time.  Time included that spent on review of records (prior notes available to me/labs/imaging if pertinent), discussing treatment and goals, answering patient's questions and coordinating care. ? ?Cc:  Lanelle Bal, PA-C ? ?Sharene Butters ?05/13/2021 1:36 PM   ? ? ?

## 2021-05-13 NOTE — Progress Notes (Signed)
Please inform patient that her thyroid and B12 are normal, thanks

## 2021-05-13 NOTE — Patient Instructions (Addendum)
It was a pleasure to see you today at our office.  ? ?Recommendations: ? ?Neurocognitive evaluation at our office ?MRI of the brain, the radiology office will call you to arrange you appointment ?Check labs today  ?Continue to replenish vitamin b12 and D ?Follow up in 1 month  after neurocognitive testing ? ?RECOMMENDATIONS FOR ALL PATIENTS WITH MEMORY PROBLEMS: ?1. Continue to exercise (Recommend 30 minutes of walking everyday, or 3 hours every week) ?2. Increase social interactions - continue going to [place] and enjoy social gatherings with friends and family ?3. Eat healthy, avoid fried foods and eat more fruits and vegetables ?4. Maintain adequate blood pressure, blood sugar, and blood cholesterol level. Reducing the risk of stroke and cardiovascular disease also helps promoting better memory. ?5. Avoid stressful situations. Live a simple life and avoid aggravations. Organize your time and prepare for the next day in anticipation. ?6. Sleep well, avoid any interruptions of sleep and avoid any distractions in the bedroom that may interfere with adequate sleep quality ?7. Avoid sugar, avoid sweets as there is a strong link between excessive sugar intake, diabetes, and cognitive impairment ?We discussed the Mediterranean diet, which has been shown to help patients reduce the risk of progressive memory disorders and reduces cardiovascular risk. This includes eating fish, eat fruits and Gut leafy vegetables, nuts like almonds and hazelnuts, walnuts, and also use olive oil. Avoid fast foods and fried foods as much as possible. Avoid sweets and sugar as sugar use has been linked to worsening of memory function. ? ?There is always a concern of gradual progression of memory problems. If this is the case, then we may need to adjust level of care according to patient needs. Support, both to the patient and caregiver, should then be put into place.  ? ? ? ? ?You have been referred for a neuropsychological evaluation  (i.e., evaluation of memory and thinking abilities). Please bring someone with you to this appointment if possible, as it is helpful for the doctor to hear from both you and another adult who knows you well. Please bring eyeglasses and hearing aids if you wear them.  ?  ?The evaluation will take approximately 3 hours and has two parts: ?  ?The first part is a clinical interview with the neuropsychologist (Dr. Melvyn Novas or Dr. Nicole Kindred). During the interview, the neuropsychologist will speak with you and the individual you brought to the appointment.  ?  ?The second part of the evaluation is testing with the doctor's technician Hinton Dyer or Maudie Mercury). During the testing, the technician will ask you to remember different types of material, solve problems, and answer some questionnaires. Your family member will not be present for this portion of the evaluation. ?  ?Please note: We must reserve several hours of the neuropsychologist's time and the psychometrician's time for your evaluation appointment. As such, there is a No-Show fee of $100. If you are unable to attend any of your appointments, please contact our office as soon as possible to reschedule.  ? ? ?FALL PRECAUTIONS: Be cautious when walking. Scan the area for obstacles that may increase the risk of trips and falls. When getting up in the mornings, sit up at the edge of the bed for a few minutes before getting out of bed. Consider elevating the bed at the head end to avoid drop of blood pressure when getting up. Walk always in a well-lit room (use night lights in the walls). Avoid area rugs or power cords from appliances in the middle  of the walkways. Use a walker or a cane if necessary and consider physical therapy for balance exercise. Get your eyesight checked regularly. ? ?FINANCIAL OVERSIGHT: Supervision, especially oversight when making financial decisions or transactions is also recommended. ? ?HOME SAFETY: Consider the safety of the kitchen when operating  appliances like stoves, microwave oven, and blender. Consider having supervision and share cooking responsibilities until no longer able to participate in those. Accidents with firearms and other hazards in the house should be identified and addressed as well. ? ? ?ABILITY TO BE LEFT ALONE: If patient is unable to contact 911 operator, consider using LifeLine, or when the need is there, arrange for someone to stay with patients. Smoking is a fire hazard, consider supervision or cessation. Risk of wandering should be assessed by caregiver and if detected at any point, supervision and safe proof recommendations should be instituted. ? ?MEDICATION SUPERVISION: Inability to self-administer medication needs to be constantly addressed. Implement a mechanism to ensure safe administration of the medications. ? ? ?DRIVING: Regarding driving, in patients with progressive memory problems, driving will be impaired. We advise to have someone else do the driving if trouble finding directions or if minor accidents are reported. Independent driving assessment is available to determine safety of driving. ? ? ?If you are interested in the driving assessment, you can contact the following: ? ?[facility] [employer] in [place] ? ?[place] [phone] ? ?[facility] [phone] ? ?[facility] [phone] or [phone] ? ? ? ?Mediterranean Diet ?A Mediterranean diet refers to food and lifestyle choices that are based on the traditions of countries located on the [employer]. This way of eating has been shown to help prevent certain conditions and improve outcomes for people who have chronic diseases, like kidney disease and heart disease. ?What are tips for following this plan? ?Lifestyle  ?Cook and eat meals together with your family, when possible. ?Drink enough fluid to keep your urine clear or pale yellow. ?Be physically active every day. This includes: ?Aerobic  exercise like running or swimming. ?Leisure activities like gardening, walking, or housework. ?Get 7-8 hours of sleep each night. ?If recommended by your health care provider, drink red wine in moderation. This means 1 glass a day for nonpregnant women and 2 glasses a day for men. A glass of wine equals 5 oz (150 mL). ?Reading food labels  ?Check the serving size of packaged foods. For foods such as rice and pasta, the serving size refers to the amount of cooked product, not dry. ?Check the total fat in packaged foods. Avoid foods that have saturated fat or trans fats. ?Check the ingredients list for added sugars, such as corn syrup. ?Shopping  ?At the grocery store, buy most of your food from the areas near the walls of the store. This includes: ?Fresh fruits and vegetables (produce). ?Grains, beans, nuts, and seeds. Some of these may be available in unpackaged forms or large amounts (in bulk). ?Fresh seafood. ?Poultry and eggs. ?Low-fat dairy products. ?Buy whole ingredients instead of prepackaged foods. ?Buy fresh fruits and vegetables in-season from local farmers markets. ?Buy frozen fruits and vegetables in resealable bags. ?If you do not have access to quality fresh seafood, buy precooked frozen shrimp or canned fish, such as tuna, salmon, or sardines. ?Buy small amounts of raw or cooked vegetables, salads, or olives from the deli or salad bar at your store. ?Stock your pantry so you always have certain foods on hand, such as olive oil, canned tuna,  canned tomatoes, rice, pasta, and beans. ?Cooking  ?Cook foods with extra-virgin olive oil instead of using butter or other vegetable oils. ?Have meat as a side dish, and have vegetables or grains as your main dish. This means having meat in small portions or adding small amounts of meat to foods like pasta or stew. ?Use beans or vegetables instead of meat in common dishes like chili or lasagna. ?Experiment with different cooking methods. Try roasting or broiling  vegetables instead of steaming or saut?eing them. ?Add frozen vegetables to soups, stews, pasta, or rice. ?Add nuts or seeds for added healthy fat at each meal. You can add these to yogurt, salads, or vegetable dishes. ?M

## 2021-05-19 ENCOUNTER — Ambulatory Visit
Admission: RE | Admit: 2021-05-19 | Discharge: 2021-05-19 | Disposition: A | Discharge status: Home / Self Care | Payer: PPO | Source: Ambulatory Visit | Attending: Physician Assistant | Admitting: Physician Assistant

## 2021-05-19 DIAGNOSIS — I6782 Cerebral ischemia: Secondary | ICD-10-CM | POA: Diagnosis not present

## 2021-05-19 DIAGNOSIS — R413 Other amnesia: Secondary | ICD-10-CM | POA: Diagnosis not present

## 2021-05-19 NOTE — Progress Notes (Signed)
Please, inform patient that the MRI results show mild chronic aging changes in the vessels and mild volume loss,  No stroke, masses, fluid or infection is seen. Thank you so much!

## 2021-05-20 ENCOUNTER — Telehealth: Payer: Self-pay | Admitting: Physician Assistant

## 2021-05-20 NOTE — Telephone Encounter (Signed)
Patient called and stated she had gotten a message that her MRI results were in.  She was calling the office back. ?

## 2021-05-30 DIAGNOSIS — Z0001 Encounter for general adult medical examination with abnormal findings: Secondary | ICD-10-CM | POA: Diagnosis not present

## 2021-05-30 DIAGNOSIS — Z8673 Personal history of transient ischemic attack (TIA), and cerebral infarction without residual deficits: Secondary | ICD-10-CM | POA: Diagnosis not present

## 2021-05-30 DIAGNOSIS — E1169 Type 2 diabetes mellitus with other specified complication: Secondary | ICD-10-CM | POA: Diagnosis not present

## 2021-05-30 DIAGNOSIS — E7849 Other hyperlipidemia: Secondary | ICD-10-CM | POA: Diagnosis not present

## 2021-05-30 DIAGNOSIS — I77819 Aortic ectasia, unspecified site: Secondary | ICD-10-CM | POA: Diagnosis not present

## 2021-05-30 DIAGNOSIS — F17210 Nicotine dependence, cigarettes, uncomplicated: Secondary | ICD-10-CM | POA: Diagnosis not present

## 2021-05-30 DIAGNOSIS — G4733 Obstructive sleep apnea (adult) (pediatric): Secondary | ICD-10-CM | POA: Diagnosis not present

## 2021-06-12 DIAGNOSIS — M545 Low back pain, unspecified: Secondary | ICD-10-CM | POA: Diagnosis not present

## 2021-06-12 DIAGNOSIS — Z6841 Body Mass Index (BMI) 40.0 and over, adult: Secondary | ICD-10-CM | POA: Diagnosis not present

## 2021-06-12 DIAGNOSIS — I10 Essential (primary) hypertension: Secondary | ICD-10-CM | POA: Diagnosis not present

## 2021-06-12 DIAGNOSIS — M25561 Pain in right knee: Secondary | ICD-10-CM | POA: Diagnosis not present

## 2021-06-13 ENCOUNTER — Ambulatory Visit: Payer: PPO | Admitting: Physician Assistant

## 2021-06-13 ENCOUNTER — Encounter: Payer: Self-pay | Admitting: Physician Assistant

## 2021-06-13 DIAGNOSIS — Z029 Encounter for administrative examinations, unspecified: Secondary | ICD-10-CM

## 2021-06-13 NOTE — Progress Notes (Incomplete)
Assessment/Plan:   Dementia likely due to    Recommendations:   Discussed safety both in and out of the home.  Discussed the importance of regular daily schedule with inclusion of crossword puzzles to maintain brain function.  Continue to monitor mood by PCP Stay active at least 30 minutes at least 3 times a week.  Naps should be scheduled and should be no longer than 60 minutes and should not occur after 2 PM.  Mediterranean diet is recommended  Control cardiovascular risk factors  Continue  10 mg   Side effects were discussed Follow up in   months.   Case discussed with Dr. Delice Lesch who agrees with the plan  Danielle Frey is a very pleasant 71 y.o. year old RH female with  a history of hypertension, hyperlipidemia, vitamin D deficiency, history of lacunar stroke, ascending aortic aneurysm 4.2 cm, seen today for evaluation of memory loss. MoCA today is 24/30, with main deficiencies in delayed recall 1/5.  Visuospatial executive 5 out of 5.  Findings are suspicious for mild cognitive impairment, likely due to Alzheimer's disease.   Subjective:    Danielle Frey is a very pleasant 71 y.o. RH female  seen today in follow up for memory loss. This patient is accompanied in the office by her who supplements the history.  Previous records as well as any outside records available were reviewed prior to todays visit.  Patient was last seen at our office on  at which time her  Patient is currently on   Any changes in memory since last visit? Patient lives with: Spouse who noticed changes as well.  Patient lives alone repeats oneself?  Disoriented when walking into a room?  Patient denies   Leaving objects in unusual places?  Patient denies   Ambulates  with difficulty?   Patient denies   Recent falls?  Patient denies   Any head injuries?  Patient denies   History of seizures?   Patient denies   Wandering behavior?  Patient denies   Patient drives?   Patient no longer drives  Patient  drives with assistance  Patient uses [employer] to drive   Any mood changes such irritability agitation?  Patient denies   Any history of depression?:  Patient denies   Hallucinations?  Patient denies   Paranoia?  Patient denies   Patient reports that he sleeps well without vivid dreams, REM behavior or sleepwalking   Patient reports vivid dreams   History of sleep apnea?  Patient denies   Any hygiene concerns?  Patient denies   Independent of bathing and dressing?  Endorsed  Does the patient needs help with medications?  pillbox pill pack  Patient denies   Who is in charge of the finances?  Patient is in charge   is in charge   assists the patient  and denies missing any bills   occasionally misses a payment. Any changes in appetite?  Patient denies   Patient have trouble swallowing? Patient denies   Does the patient cook?  Patient denies   Any kitchen accidents such as leaving the stove on? Patient denies   Any headaches?  Patient denies   The double vision? Patient denies   Any focal numbness or tingling?  Patient denies   Chronic back pain Patient denies   Unilateral weakness?  Patient denies   Any tremors?  Patient denies   Any history of anosmia?  Patient denies   Any incontinence of urine?  Patient denies  Any bowel dysfunction?   Patient denies     Constipation     diarrhea    Initial Visit 05/13/21 The patient is seen in neurologic consultation at the request of Lanelle Bal, PA-C for the evaluation of memory.  The patient is here alone.   This is a very pleasant 71 y.o. year old RH  female who has had memory issues for about 1 year, when she began to experience problems with short-term memory.  Symptoms were worse about 6 or 7 months ago, when she was doing yard work, and could not "put things together, and he did not know where I was or where to go which was very frightening ".  MRI at the time, revealed a tiny lacunar stroke, although she did not have any other organic symptoms such  as one-sided weakness, numbness or tingling or vision changes.  She is on Plavix and statin.  She says that since that time, she has to write a list of things to do otherwise she will forget.  She does repeat herself quite often.  She denies having any more episodes of disorientation.  She ambulates with some balance difficulty, and uses a cane if she is on uneven ground.  She denies any head injuries.  No wandering behavior.  She allows her husband to drive for her.  Her mood is good "I am always upbeat type of person".  Denies depression, anxiety or irritability.  She sleeps only 2 hours a night, "I have always been a night owl, I never took anything because I do not like taking medications ".  She denies any vivid dreams, or sleepwalking, hallucinations, paranoia, hygiene concerns.  She is independent of bathing and dressing.  Her medications are in a pillbox, but she has still living in a place where she can see them otherwise she will forget.  She is still good with finances.  She denies missing any bills.  Her appetite is increased, she denies any trouble swallowing.  She cooks, and enjoys baking, denies leaving the stove or the faucet on.  She denies any headaches.  She denies tremors, or anosmia.  No history of seizures.  Denies urine incontinence, retention, constipation or diarrhea.  Denies a history of sleep apnea, alcohol or tobacco.  She has family history of dementia in paternal grandfather, maternal grandmother.   MRI 04/29/21 Mild chronic microvascular ischemic changes of the white matter and parenchymal volume loss, stable. PREVIOUS MEDICATIONS:   CURRENT MEDICATIONS:  Outpatient Encounter Medications as of 06/13/2021  Medication Sig   atorvastatin (LIPITOR) 40 MG tablet Take 40 mg by mouth daily.   cetirizine (ZYRTEC) 10 MG tablet Take 10 mg by mouth daily.   clopidogrel (PLAVIX) 75 MG tablet Take 75 mg by mouth daily.   donepezil (ARICEPT) 5 MG tablet Take 5 mg by mouth at bedtime.    naproxen sodium (ALEVE) 220 MG tablet Take by mouth.   vitamin B-12 (CYANOCOBALAMIN) 1000 MCG tablet Take 1,000 mcg by mouth daily.   No facility-administered encounter medications on file as of 06/13/2021.        View : No data to display.             View : No data to display.          Objective:     PHYSICAL EXAMINATION:    VITALS:  There were no vitals filed for this visit.  GEN:  The patient appears stated age and is in NAD. HEENT:  Normocephalic,  atraumatic.   Neurological examination:  General: NAD, well-groomed, appears stated age. Orientation: The patient is alert. Oriented to person, place and date Cranial nerves: There is good facial symmetry.The speech is fluent and clear. No aphasia or dysarthria. Fund of knowledge is appropriate. Recent and remote memory are impaired. Attention and concentration are reduced.  Able to name objects and repeat phrases.  Hearing is intact to conversational tone.    Sensation: Sensation is intact to light touch throughout Motor: Strength is at least antigravity x4. Tremors: none  DTR's 2/4 in UE/LE     Movement examination: Tone: There is normal tone in the UE/LE Abnormal movements:  no tremor.  No myoclonus.  No asterixis.   Coordination:  There is no decremation with RAM's. Normal finger to nose  Gait and Station: The patient has no difficulty arising out of a deep-seated chair without the use of the hands. The patient's stride length is good.  Gait is cautious and narrow.    Total time spent on today's visit was minutes, including both face-to-face time and nonface-to-face time. Time included that spent on review of records (prior notes available to me/labs/imaging if pertinent), discussing treatment and goals, answering patient's questions and coordinating care.  Cc:  Lanelle Bal, PA-C Sharene Butters, PA-C

## 2021-07-04 DIAGNOSIS — M25561 Pain in right knee: Secondary | ICD-10-CM | POA: Diagnosis not present

## 2021-07-07 ENCOUNTER — Other Ambulatory Visit (HOSPITAL_COMMUNITY): Payer: Self-pay | Admitting: Orthopedic Surgery

## 2021-07-07 DIAGNOSIS — M545 Low back pain, unspecified: Secondary | ICD-10-CM | POA: Diagnosis not present

## 2021-07-07 DIAGNOSIS — M25561 Pain in right knee: Secondary | ICD-10-CM | POA: Diagnosis not present

## 2021-07-07 DIAGNOSIS — M25552 Pain in left hip: Secondary | ICD-10-CM | POA: Diagnosis not present

## 2021-07-07 DIAGNOSIS — M25511 Pain in right shoulder: Secondary | ICD-10-CM | POA: Diagnosis not present

## 2021-07-07 DIAGNOSIS — T84032A Mechanical loosening of internal right knee prosthetic joint, initial encounter: Secondary | ICD-10-CM

## 2021-07-07 DIAGNOSIS — M25551 Pain in right hip: Secondary | ICD-10-CM | POA: Diagnosis not present

## 2021-07-10 ENCOUNTER — Ambulatory Visit (HOSPITAL_COMMUNITY)
Admission: RE | Admit: 2021-07-10 | Discharge: 2021-07-10 | Disposition: A | Discharge status: Home / Self Care | Payer: PPO | Source: Ambulatory Visit | Attending: Orthopedic Surgery | Admitting: Orthopedic Surgery

## 2021-07-10 DIAGNOSIS — T84032A Mechanical loosening of internal right knee prosthetic joint, initial encounter: Secondary | ICD-10-CM | POA: Diagnosis not present

## 2021-07-10 DIAGNOSIS — R296 Repeated falls: Secondary | ICD-10-CM | POA: Diagnosis not present

## 2021-07-10 MED ORDER — TECHNETIUM TC 99M MEDRONATE IV KIT
20.0000 | PACK | Freq: Once | INTRAVENOUS | Status: AC | PRN
  Administered 2021-07-10: 19.1 via INTRAVENOUS

## 2021-07-18 DIAGNOSIS — M25561 Pain in right knee: Secondary | ICD-10-CM | POA: Diagnosis not present

## 2021-07-24 DIAGNOSIS — R030 Elevated blood-pressure reading, without diagnosis of hypertension: Secondary | ICD-10-CM | POA: Diagnosis not present

## 2021-07-24 DIAGNOSIS — L259 Unspecified contact dermatitis, unspecified cause: Secondary | ICD-10-CM | POA: Diagnosis not present

## 2021-07-24 DIAGNOSIS — Z6841 Body Mass Index (BMI) 40.0 and over, adult: Secondary | ICD-10-CM | POA: Diagnosis not present

## 2021-07-24 DIAGNOSIS — M545 Low back pain, unspecified: Secondary | ICD-10-CM | POA: Diagnosis not present

## 2021-07-24 DIAGNOSIS — M79604 Pain in right leg: Secondary | ICD-10-CM | POA: Diagnosis not present

## 2021-08-06 ENCOUNTER — Ambulatory Visit
Admission: RE | Admit: 2021-08-06 | Discharge: 2021-08-06 | Disposition: A | Discharge status: Home / Self Care | Payer: PPO | Source: Ambulatory Visit | Attending: Family Medicine | Admitting: Family Medicine

## 2021-08-06 DIAGNOSIS — Z1231 Encounter for screening mammogram for malignant neoplasm of breast: Secondary | ICD-10-CM

## 2021-08-08 DIAGNOSIS — M4316 Spondylolisthesis, lumbar region: Secondary | ICD-10-CM | POA: Diagnosis not present

## 2021-08-08 DIAGNOSIS — Q766 Other congenital malformations of ribs: Secondary | ICD-10-CM | POA: Diagnosis not present

## 2021-08-08 DIAGNOSIS — M4804 Spinal stenosis, thoracic region: Secondary | ICD-10-CM | POA: Diagnosis not present

## 2021-08-08 DIAGNOSIS — M5126 Other intervertebral disc displacement, lumbar region: Secondary | ICD-10-CM | POA: Diagnosis not present

## 2021-08-08 DIAGNOSIS — M5136 Other intervertebral disc degeneration, lumbar region: Secondary | ICD-10-CM | POA: Diagnosis not present

## 2021-08-08 DIAGNOSIS — M48061 Spinal stenosis, lumbar region without neurogenic claudication: Secondary | ICD-10-CM | POA: Diagnosis not present

## 2021-08-08 DIAGNOSIS — M47816 Spondylosis without myelopathy or radiculopathy, lumbar region: Secondary | ICD-10-CM | POA: Diagnosis not present

## 2021-08-08 DIAGNOSIS — M4807 Spinal stenosis, lumbosacral region: Secondary | ICD-10-CM | POA: Diagnosis not present

## 2021-10-22 DIAGNOSIS — M4316 Spondylolisthesis, lumbar region: Secondary | ICD-10-CM | POA: Diagnosis not present

## 2021-10-22 DIAGNOSIS — Z6841 Body Mass Index (BMI) 40.0 and over, adult: Secondary | ICD-10-CM | POA: Diagnosis not present

## 2021-11-05 DIAGNOSIS — R030 Elevated blood-pressure reading, without diagnosis of hypertension: Secondary | ICD-10-CM | POA: Diagnosis not present

## 2021-11-05 DIAGNOSIS — Z6841 Body Mass Index (BMI) 40.0 and over, adult: Secondary | ICD-10-CM | POA: Diagnosis not present

## 2021-11-05 DIAGNOSIS — B029 Zoster without complications: Secondary | ICD-10-CM | POA: Diagnosis not present

## 2021-11-12 DIAGNOSIS — Z780 Asymptomatic menopausal state: Secondary | ICD-10-CM | POA: Diagnosis not present

## 2021-11-12 DIAGNOSIS — M85852 Other specified disorders of bone density and structure, left thigh: Secondary | ICD-10-CM | POA: Diagnosis not present

## 2021-11-12 DIAGNOSIS — M85851 Other specified disorders of bone density and structure, right thigh: Secondary | ICD-10-CM | POA: Diagnosis not present

## 2021-11-17 DIAGNOSIS — Z6841 Body Mass Index (BMI) 40.0 and over, adult: Secondary | ICD-10-CM | POA: Diagnosis not present

## 2021-11-17 DIAGNOSIS — M4316 Spondylolisthesis, lumbar region: Secondary | ICD-10-CM | POA: Diagnosis not present

## 2021-11-18 ENCOUNTER — Telehealth: Payer: Self-pay

## 2021-11-18 NOTE — Telephone Encounter (Signed)
   Patient Name: Danielle Frey  DOB: 10-14-1950 MRN: [id number]  Primary Cardiologist: Carlyle Dolly, MD  Chart reviewed as part of pre-operative protocol coverage. Pharmacy clearance only. [name] was last seen 02/20/21 by Dr. Harl Bowie. 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 It does not appear this was performed.   She is maintained on Plavix due to history of stroke. Recommendations regarding Plavix will need to come from neurology or primary care.   I will route this recommendation to the requesting party via Epic fax function and remove from pre-op pool.  Will route to [facility] triage team to ensure she has her CT and follow up with Dr. Harl Bowie.   Please call with questions.  Loel Dubonnet, NP 11/18/2021, 12:52 PM

## 2021-11-18 NOTE — Telephone Encounter (Signed)
Patient states that she does not remember anyone telling her that she needed to have a chest CT done. States that if this is something that she needs so that she can have back surgery then she is ok with having it done. States that she just needs for someone to tell her the date and time to be there.

## 2021-11-18 NOTE — Telephone Encounter (Signed)
   Pre-operative Risk Assessment    Patient Name: Danielle Frey  DOB: Jun 13, 1950 MRN: [id number]     Request for Surgical Clearance    Procedure:  L3-4, L4-5 posterior lateral interbody fusion   Date of Surgery:  Clearance TBD                                 Surgeon:  Dr. Kristeen Miss  Surgeon's Group or Practice Name:  [facility]  Phone number:  [phone] Fax number:  [phone] Attn: Janett Billow    Type of Clearance Requested:   - Pharmacy:  Hold Clopidogrel (Plavix)     Type of Anesthesia:  General    Additional requests/questions:    Oneal Grout   11/18/2021, 11:56 AM

## 2021-11-18 NOTE — Telephone Encounter (Signed)
Clearance we received was for pharmacy clearance only. Already provided clearance from that perspective. 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 Would recommend she proceed with CT in the next 2-3 months and follow up with Dr. Harl Bowie afterwards. CCing Dr. Harl Bowie as FYI only.   Loel Dubonnet, NP

## 2021-11-24 ENCOUNTER — Other Ambulatory Visit: Payer: Self-pay | Admitting: Neurological Surgery

## 2021-12-04 DIAGNOSIS — L280 Lichen simplex chronicus: Secondary | ICD-10-CM | POA: Diagnosis not present

## 2021-12-04 DIAGNOSIS — B029 Zoster without complications: Secondary | ICD-10-CM | POA: Diagnosis not present

## 2021-12-10 NOTE — Pre-Procedure Instructions (Signed)
Surgical Instructions    Your procedure is scheduled on Tuesday, November 28th.  Report to [facility] Main Entrance "A" at 05:30 A.M., then check in with the Admitting office.  Call this number if you have problems the morning of surgery:  [phone]   If you have any questions prior to your surgery date call [phone]: Open Monday-Friday 8am-4pm    Remember:  Do not eat after midnight the night before your surgery  You may drink clear liquids until 04:30 AM the morning of your surgery.   Clear liquids allowed are: Water, Non-Citrus Juices (without pulp), Carbonated Beverages, Clear Tea, Black Coffee Only (NO MILK, CREAM OR POWDERED CREAMER of any kind), and Gatorade.    Take these medicines the morning of surgery with A SIP OF WATER: none    Follow your surgeon's instructions on when to stop clopidogrel (PLAVIX).  If no instructions were given by your surgeon then you will need to call the office to get those instructions.     As of today, STOP taking any Aspirin (unless otherwise instructed by your surgeon) Aleve, Naproxen, Ibuprofen, Motrin, Advil, Goody's, BC's, all herbal medications, fish oil, and all vitamins.                     Do NOT Smoke (Tobacco/Vaping) for 24 hours prior to your procedure.  If you use a CPAP at night, you may bring your mask/headgear for your overnight stay.   Contacts, glasses, piercing's, hearing aid's, dentures or partials may not be worn into surgery, please bring cases for these belongings.    For patients admitted to the hospital, discharge time will be determined by your treatment team.   Patients discharged the day of surgery will not be allowed to drive home, and someone needs to stay with them for 24 hours.  SURGICAL WAITING ROOM VISITATION Patients having surgery or a procedure may have no more than 2 support people in the waiting area - these visitors may rotate.   Children under the age of 47 must have an adult with them who is not  the patient. If the patient needs to stay at the hospital during part of their recovery, the visitor guidelines for inpatient rooms apply. Pre-op nurse will coordinate an appropriate time for 1 support person to accompany patient in pre-op.  This support person may not rotate.   Please refer to the [facility] website for the visitor guidelines for Inpatients (after your surgery is over and you are in a regular room).    Special instructions:   [place]- Preparing For Surgery  Before surgery, you can play an important role. Because skin is not sterile, your skin needs to be as free of germs as possible. You can reduce the number of germs on your skin by washing with CHG (chlorahexidine gluconate) Soap before surgery.  CHG is an antiseptic cleaner which kills germs and bonds with the skin to continue killing germs even after washing.    Oral Hygiene is also important to reduce your risk of infection.  Remember - BRUSH YOUR TEETH THE MORNING OF SURGERY WITH YOUR REGULAR TOOTHPASTE  Please do not use if you have an allergy to CHG or antibacterial soaps. If your skin becomes reddened/irritated stop using the CHG.  Do not shave (including legs and underarms) for at least 48 hours prior to first CHG shower. It is OK to shave your face.  Please follow these instructions carefully.   Shower the [employer] BEFORE SURGERY  and the MORNING OF SURGERY  If you chose to wash your hair, wash your hair first as usual with your normal shampoo.  After you shampoo, rinse your hair and body thoroughly to remove the shampoo.  Use CHG Soap as you would any other liquid soap. You can apply CHG directly to the skin and wash gently with a scrungie or a clean washcloth.   Apply the CHG Soap to your body ONLY FROM THE NECK DOWN.  Do not use on open wounds or open sores. Avoid contact with your eyes, ears, mouth and genitals (private parts). Wash Face and genitals (private parts)  with your normal soap.   Wash  thoroughly, paying special attention to the area where your surgery will be performed.  Thoroughly rinse your body with warm water from the neck down.  DO NOT shower/wash with your normal soap after using and rinsing off the CHG Soap.  Pat yourself dry with a CLEAN TOWEL.  Wear CLEAN PAJAMAS to bed the night before surgery  Place CLEAN SHEETS on your bed the night before your surgery  DO NOT SLEEP WITH PETS.   Day of Surgery: Take a shower with CHG soap. Do not wear jewelry or makeup Do not wear lotions, powders, perfumes, or deodorant. Do not shave 48 hours prior to surgery.   Do not bring valuables to the hospital. [facility] is not responsible for any belongings or valuables. Do not wear nail polish, gel polish, artificial nails, or any other type of covering on natural nails (fingers and toes) If you have artificial nails or gel coating that need to be removed by a nail salon, please have this removed prior to surgery. Artificial nails or gel coating may interfere with anesthesia's ability to adequately monitor your vital signs. Wear Clean/Comfortable clothing the morning of surgery Remember to brush your teeth WITH YOUR REGULAR TOOTHPASTE.   Please read over the following fact sheets that you were given.    If you received a COVID test during your pre-op visit  it is requested that you wear a mask when out in public, stay away from anyone that may not be feeling well and notify your surgeon if you develop symptoms. If you have been in contact with anyone that has tested positive in the last 10 days please notify you surgeon.

## 2021-12-11 ENCOUNTER — Other Ambulatory Visit: Payer: Self-pay

## 2021-12-11 ENCOUNTER — Encounter (HOSPITAL_COMMUNITY): Payer: Self-pay | Admitting: *Deleted

## 2021-12-11 ENCOUNTER — Encounter (HOSPITAL_COMMUNITY)
Admission: RE | Admit: 2021-12-11 | Discharge: 2021-12-11 | Disposition: A | Discharge status: Home / Self Care | Payer: PPO | Source: Ambulatory Visit | Attending: Neurological Surgery | Admitting: Neurological Surgery

## 2021-12-11 VITALS — BP 157/80 | HR 70 | Temp 98.1°F | Resp 18 | Ht 66.0 in | Wt 264.7 lb

## 2021-12-11 DIAGNOSIS — Z01812 Encounter for preprocedural laboratory examination: Secondary | ICD-10-CM | POA: Diagnosis not present

## 2021-12-11 DIAGNOSIS — Z01818 Encounter for other preprocedural examination: Secondary | ICD-10-CM

## 2021-12-11 DIAGNOSIS — I7121 Aneurysm of the ascending aorta, without rupture: Secondary | ICD-10-CM | POA: Insufficient documentation

## 2021-12-11 HISTORY — DX: Transient cerebral ischemic attack, unspecified: G45.9

## 2021-12-11 HISTORY — DX: Congenital aneurysm of aorta: Q25.43

## 2021-12-11 HISTORY — DX: Aneurysm of the ascending aorta, without rupture: I71.21

## 2021-12-11 LAB — TYPE AND SCREEN
ABO/RH(D): A NEG
Antibody Screen: NEGATIVE

## 2021-12-11 LAB — CBC
HCT: 39.5 % (ref 36.0–46.0)
Hemoglobin: 12.9 g/dL (ref 12.0–15.0)
MCH: 30.5 pg (ref 26.0–34.0)
MCHC: 32.7 g/dL (ref 30.0–36.0)
MCV: 93.4 fL (ref 80.0–100.0)
Platelets: 257 10*3/uL (ref 150–400)
RBC: 4.23 MIL/uL (ref 3.87–5.11)
RDW: 13.3 % (ref 11.5–15.5)
WBC: 6.5 10*3/uL (ref 4.0–10.5)
nRBC: 0 % (ref 0.0–0.2)

## 2021-12-11 LAB — SURGICAL PCR SCREEN
MRSA, PCR: NEGATIVE
Staphylococcus aureus: NEGATIVE

## 2021-12-11 LAB — BASIC METABOLIC PANEL
Anion gap: 6 (ref 5–15)
BUN: 9 mg/dL (ref 8–23)
CO2: 27 mmol/L (ref 22–32)
Calcium: 9.3 mg/dL (ref 8.9–10.3)
Chloride: 106 mmol/L (ref 98–111)
Creatinine, Ser: 0.76 mg/dL (ref 0.44–1.00)
GFR, Estimated: >60 mL/min (ref >60)
Glucose, Bld: 88 mg/dL (ref 70–99)
Potassium: 3.9 mmol/L (ref 3.5–5.1)
Sodium: 139 mmol/L (ref 135–145)

## 2021-12-11 NOTE — Progress Notes (Signed)
PCP - Lanelle Bal, PA-C Cardiologist - Dr. Carlyle Dolly  PPM/ICD - denies   Chest x-ray - denies EKG - 02/20/21 Stress Test - denies ECHO - 02/07/21 Cardiac Cath - denies  Sleep Study - denies   DM- denies  Last dose of GLP1 agonist-  n/a   Blood Thinner Instructions: Hold Plavix 5 days Aspirin Instructions: n/a  ERAS Protcol - yes, no drink   COVID TEST- n/a   Anesthesia review: yes, cardiac hx  Patient denies shortness of breath, fever, cough and chest pain at PAT appointment   All instructions explained to the patient, with a verbal understanding of the material. Patient agrees to go over the instructions while at home for a better understanding.  The opportunity to ask questions was provided.

## 2021-12-12 NOTE — Anesthesia Preprocedure Evaluation (Addendum)
Anesthesia Evaluation  Patient identified by MRN, date of birth, ID band Patient awake    Reviewed: Allergy & Precautions, NPO status , Patient's Chart, lab work & pertinent test results  History of Anesthesia Complications Negative for: history of anesthetic complications  Airway Mallampati: I  TM Distance: >3 FB Neck ROM: Full    Dental  (+) Edentulous Upper, Edentulous Lower   Pulmonary sleep apnea (no longer using CPAP) , former smoker   Pulmonary exam normal        Cardiovascular Normal cardiovascular exam   Mild ascending aortic aneurysm, 4.2cm    Neuro/Psych TIA negative psych ROS   GI/Hepatic negative GI ROS, Neg liver ROS,,,  Endo/Other    Morbid obesity  Renal/GU negative Renal ROS     Musculoskeletal negative musculoskeletal ROS (+)    Abdominal   Peds  Hematology  On plavix    Anesthesia Other Findings See PAT note  Reproductive/Obstetrics                             Anesthesia Physical Anesthesia Plan  ASA: 3  Anesthesia Plan: General   Post-op Pain Management: Tylenol PO (pre-op)*   Induction: Intravenous  PONV Risk Score and Plan: 3 and Treatment may vary due to age or medical condition, Ondansetron and Dexamethasone  Airway Management Planned: Oral ETT  Additional Equipment: None  Intra-op Plan:   Post-operative Plan: Extubation in OR  Informed Consent: I have reviewed the patients History and Physical, chart, labs and discussed the procedure including the risks, benefits and alternatives for the proposed anesthesia with the patient or authorized representative who has indicated his/her understanding and acceptance.     Dental advisory given  Plan Discussed with: CRNA and Anesthesiologist  Anesthesia Plan Comments:         Anesthesia Quick Evaluation

## 2021-12-12 NOTE — Progress Notes (Signed)
Anesthesia Chart Review:  Patient was evaluated by cardiologist Dr. Carlyle Dolly 02/20/2021 for ascending aorta dilation.  Patient had echo at [facility] January 2023 showing EF 60 to 42%, grade 1 diastolic dysfunction, moderate LAE, ascending aorta 4.2 cm.  Dr. Laruth Bouchard recommended CTA of the chest for better evaluation of the thoracic aorta.  Patient has not yet had this done.  Cardiology APP Laurann [place], NP commented on this with respect to surgery in telephone encounter 11/18/2021 stating, "Clearance we received was for pharmacy clearance only. Already provided clearance from that perspective. 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 Would recommend she proceed with CT in the next 2-3 months and follow up with Dr. Harl Bowie afterwards. CCing Dr. Harl Bowie as Juluis Rainier only."  Patient maintained on Plavix for history of TIA.  She reports she was instructed to hold 5 days prior to surgery.  EKG 02/20/2021: NSR.  Rate 81.  TTE 02/07/2021 (Care Everywhere): Summary   1. The left ventricle is normal in size with mildly increased wall  thickness.   2. The left ventricular systolic function is normal, LVEF is visually  estimated at 60-65%.    3. There is grade I diastolic dysfunction (impaired relaxation).    4. The left atrium is moderately dilated in size.    5. The right ventricle is normal in size, with normal systolic function.    6. Ascending aorta is dilated measuring 4.2 cm as seen.    Wynonia Musty [facility]/Anesthesiology Phone [phone] 12/12/2021 4:09 PM

## 2021-12-23 ENCOUNTER — Encounter (HOSPITAL_COMMUNITY)
Admission: RE | Disposition: A | Discharge status: Home / Self Care | Payer: Self-pay | Source: Home / Self Care | Attending: Neurological Surgery

## 2021-12-23 ENCOUNTER — Inpatient Hospital Stay (HOSPITAL_COMMUNITY): Payer: PPO | Admitting: Anesthesiology

## 2021-12-23 ENCOUNTER — Other Ambulatory Visit: Payer: Self-pay

## 2021-12-23 ENCOUNTER — Inpatient Hospital Stay (HOSPITAL_COMMUNITY): Payer: PPO

## 2021-12-23 ENCOUNTER — Inpatient Hospital Stay (HOSPITAL_COMMUNITY)
Admission: RE | Admit: 2021-12-23 | Discharge: 2021-12-25 | DRG: 454 | Disposition: A | Discharge status: Home / Self Care | Payer: PPO | Attending: Neurological Surgery | Admitting: Neurological Surgery

## 2021-12-23 ENCOUNTER — Encounter (HOSPITAL_COMMUNITY): Payer: Self-pay | Admitting: Neurological Surgery

## 2021-12-23 ENCOUNTER — Inpatient Hospital Stay (HOSPITAL_COMMUNITY): Payer: PPO | Admitting: Physician Assistant

## 2021-12-23 DIAGNOSIS — G4730 Sleep apnea, unspecified: Secondary | ICD-10-CM | POA: Diagnosis not present

## 2021-12-23 DIAGNOSIS — M4316 Spondylolisthesis, lumbar region: Secondary | ICD-10-CM | POA: Diagnosis not present

## 2021-12-23 DIAGNOSIS — M48062 Spinal stenosis, lumbar region with neurogenic claudication: Principal | ICD-10-CM | POA: Diagnosis present

## 2021-12-23 DIAGNOSIS — Z87891 Personal history of nicotine dependence: Secondary | ICD-10-CM | POA: Diagnosis not present

## 2021-12-23 DIAGNOSIS — Z8673 Personal history of transient ischemic attack (TIA), and cerebral infarction without residual deficits: Secondary | ICD-10-CM

## 2021-12-23 DIAGNOSIS — M47816 Spondylosis without myelopathy or radiculopathy, lumbar region: Secondary | ICD-10-CM

## 2021-12-23 DIAGNOSIS — Z79899 Other long term (current) drug therapy: Secondary | ICD-10-CM

## 2021-12-23 DIAGNOSIS — Z7902 Long term (current) use of antithrombotics/antiplatelets: Secondary | ICD-10-CM

## 2021-12-23 DIAGNOSIS — Z90710 Acquired absence of both cervix and uterus: Secondary | ICD-10-CM | POA: Diagnosis not present

## 2021-12-23 DIAGNOSIS — M5416 Radiculopathy, lumbar region: Secondary | ICD-10-CM | POA: Diagnosis present

## 2021-12-23 DIAGNOSIS — Z9989 Dependence on other enabling machines and devices: Secondary | ICD-10-CM | POA: Diagnosis not present

## 2021-12-23 DIAGNOSIS — E785 Hyperlipidemia, unspecified: Secondary | ICD-10-CM | POA: Diagnosis present

## 2021-12-23 DIAGNOSIS — Z6841 Body Mass Index (BMI) 40.0 and over, adult: Secondary | ICD-10-CM

## 2021-12-23 DIAGNOSIS — G4733 Obstructive sleep apnea (adult) (pediatric): Secondary | ICD-10-CM | POA: Diagnosis not present

## 2021-12-23 DIAGNOSIS — Z96651 Presence of right artificial knee joint: Secondary | ICD-10-CM | POA: Diagnosis present

## 2021-12-23 DIAGNOSIS — D62 Acute posthemorrhagic anemia: Secondary | ICD-10-CM | POA: Diagnosis not present

## 2021-12-23 DIAGNOSIS — Z981 Arthrodesis status: Secondary | ICD-10-CM | POA: Diagnosis not present

## 2021-12-23 LAB — POCT I-STAT, CHEM 8
BUN: 9 mg/dL (ref 8–23)
Calcium, Ion: 1.18 mmol/L (ref 1.15–1.40)
Chloride: 106 mmol/L (ref 98–111)
Creatinine, Ser: 0.6 mg/dL (ref 0.44–1.00)
Glucose, Bld: 132 mg/dL — ABNORMAL HIGH (ref 70–99)
HCT: 29 % — ABNORMAL LOW (ref 36.0–46.0)
Hemoglobin: 9.9 g/dL — ABNORMAL LOW (ref 12.0–15.0)
Potassium: 4.2 mmol/L (ref 3.5–5.1)
Sodium: 140 mmol/L (ref 135–145)
TCO2: 26 mmol/L (ref 22–32)

## 2021-12-23 LAB — ABO/RH: ABO/RH(D): A NEG

## 2021-12-23 SURGERY — POSTERIOR LUMBAR FUSION 2 LEVEL
Anesthesia: General | Site: Back

## 2021-12-23 MED ORDER — BUPIVACAINE HCL (PF) 0.5 % IJ SOLN
INTRAMUSCULAR | Status: AC
  Filled 2021-12-23: qty 30

## 2021-12-23 MED ORDER — THROMBIN 5000 UNITS EX SOLR
CUTANEOUS | Status: AC
  Filled 2021-12-23: qty 5000

## 2021-12-23 MED ORDER — ONDANSETRON HCL 4 MG/2ML IJ SOLN: 4.0000 mg | Freq: Once | INTRAMUSCULAR | Status: DC | PRN

## 2021-12-23 MED ORDER — LACTATED RINGERS IV SOLN: INTRAVENOUS | Status: DC

## 2021-12-23 MED ORDER — DOCUSATE SODIUM 100 MG PO CAPS
100.0000 mg | ORAL_CAPSULE | Freq: Two times a day (BID) | ORAL | Status: DC
  Administered 2021-12-23 – 2021-12-25 (×4): 100 mg via ORAL
  Filled 2021-12-23 (×4): qty 1

## 2021-12-23 MED ORDER — METHOCARBAMOL 500 MG PO TABS
500.0000 mg | ORAL_TABLET | Freq: Four times a day (QID) | ORAL | Status: DC | PRN
  Administered 2021-12-24 – 2021-12-25 (×2): 500 mg via ORAL
  Filled 2021-12-23 (×2): qty 1

## 2021-12-23 MED ORDER — EPHEDRINE 5 MG/ML INJ
INTRAVENOUS | Status: AC
  Filled 2021-12-23: qty 5

## 2021-12-23 MED ORDER — LACTATED RINGERS IV SOLN: INTRAVENOUS | Status: DC | PRN

## 2021-12-23 MED ORDER — MIDAZOLAM HCL 5 MG/5ML IJ SOLN
INTRAMUSCULAR | Status: DC | PRN
  Administered 2021-12-23: 1 mg via INTRAVENOUS

## 2021-12-23 MED ORDER — ONDANSETRON HCL 4 MG PO TABS: 4.0000 mg | ORAL_TABLET | Freq: Four times a day (QID) | ORAL | Status: DC | PRN

## 2021-12-23 MED ORDER — ONDANSETRON HCL 4 MG/2ML IJ SOLN
INTRAMUSCULAR | Status: AC
  Filled 2021-12-23: qty 2

## 2021-12-23 MED ORDER — CHLORHEXIDINE GLUCONATE CLOTH 2 % EX PADS: 6.0000 | MEDICATED_PAD | Freq: Once | CUTANEOUS | Status: DC

## 2021-12-23 MED ORDER — FENTANYL CITRATE (PF) 250 MCG/5ML IJ SOLN
INTRAMUSCULAR | Status: DC | PRN
  Administered 2021-12-23 (×5): 50 ug via INTRAVENOUS

## 2021-12-23 MED ORDER — METHOCARBAMOL 1000 MG/10ML IJ SOLN
500.0000 mg | Freq: Four times a day (QID) | INTRAVENOUS | Status: DC | PRN
  Filled 2021-12-23: qty 5

## 2021-12-23 MED ORDER — OXYCODONE HCL 5 MG/5ML PO SOLN: 5.0000 mg | Freq: Once | ORAL | Status: DC | PRN

## 2021-12-23 MED ORDER — PHENYLEPHRINE 80 MCG/ML (10ML) SYRINGE FOR IV PUSH (FOR BLOOD PRESSURE SUPPORT)
PREFILLED_SYRINGE | INTRAVENOUS | Status: AC
  Filled 2021-12-23: qty 10

## 2021-12-23 MED ORDER — DEXAMETHASONE SODIUM PHOSPHATE 10 MG/ML IJ SOLN
INTRAMUSCULAR | Status: DC | PRN
  Administered 2021-12-23: 10 mg via INTRAVENOUS

## 2021-12-23 MED ORDER — PROPOFOL 10 MG/ML IV BOLUS
INTRAVENOUS | Status: DC | PRN
  Administered 2021-12-23: 120 mg via INTRAVENOUS

## 2021-12-23 MED ORDER — ONDANSETRON HCL 4 MG/2ML IJ SOLN: 4.0000 mg | Freq: Four times a day (QID) | INTRAMUSCULAR | Status: DC | PRN

## 2021-12-23 MED ORDER — LIDOCAINE 2% (20 MG/ML) 5 ML SYRINGE
INTRAMUSCULAR | Status: DC | PRN
  Administered 2021-12-23: 60 mg via INTRAVENOUS

## 2021-12-23 MED ORDER — PHENYLEPHRINE 80 MCG/ML (10ML) SYRINGE FOR IV PUSH (FOR BLOOD PRESSURE SUPPORT)
PREFILLED_SYRINGE | INTRAVENOUS | Status: DC | PRN
  Administered 2021-12-23 (×5): 80 ug via INTRAVENOUS

## 2021-12-23 MED ORDER — DONEPEZIL HCL 5 MG PO TABS
5.0000 mg | ORAL_TABLET | Freq: Every day | ORAL | Status: DC
  Administered 2021-12-23 – 2021-12-24 (×2): 5 mg via ORAL
  Filled 2021-12-23 (×3): qty 1

## 2021-12-23 MED ORDER — SODIUM CHLORIDE 0.9 % IV SOLN: INTRAVENOUS | Status: DC | PRN

## 2021-12-23 MED ORDER — FLEET ENEMA 7-19 GM/118ML RE ENEM: 1.0000 | ENEMA | Freq: Once | RECTAL | Status: DC | PRN

## 2021-12-23 MED ORDER — THROMBIN 5000 UNITS EX SOLR
OROMUCOSAL | Status: DC | PRN
  Administered 2021-12-23: 5 mL via TOPICAL

## 2021-12-23 MED ORDER — SODIUM CHLORIDE 0.9% FLUSH: 3.0000 mL | Freq: Two times a day (BID) | INTRAVENOUS | Status: DC

## 2021-12-23 MED ORDER — POLYETHYLENE GLYCOL 3350 17 G PO PACK: 17.0000 g | PACK | Freq: Every day | ORAL | Status: DC | PRN

## 2021-12-23 MED ORDER — MIDAZOLAM HCL 2 MG/2ML IJ SOLN
INTRAMUSCULAR | Status: AC
  Filled 2021-12-23: qty 2

## 2021-12-23 MED ORDER — THROMBIN 20000 UNITS EX SOLR
CUTANEOUS | Status: AC
  Filled 2021-12-23: qty 20000

## 2021-12-23 MED ORDER — ROCURONIUM BROMIDE 10 MG/ML (PF) SYRINGE
PREFILLED_SYRINGE | INTRAVENOUS | Status: DC | PRN
  Administered 2021-12-23: 30 mg via INTRAVENOUS
  Administered 2021-12-23 (×2): 20 mg via INTRAVENOUS
  Administered 2021-12-23 (×2): 30 mg via INTRAVENOUS
  Administered 2021-12-23 (×2): 20 mg via INTRAVENOUS
  Administered 2021-12-23: 50 mg via INTRAVENOUS

## 2021-12-23 MED ORDER — ALUM & MAG HYDROXIDE-SIMETH 200-200-20 MG/5ML PO SUSP: 30.0000 mL | Freq: Four times a day (QID) | ORAL | Status: DC | PRN

## 2021-12-23 MED ORDER — DEXAMETHASONE SODIUM PHOSPHATE 10 MG/ML IJ SOLN
INTRAMUSCULAR | Status: AC
  Filled 2021-12-23: qty 1

## 2021-12-23 MED ORDER — ORAL CARE MOUTH RINSE: 15.0000 mL | Freq: Once | OROMUCOSAL | Status: AC

## 2021-12-23 MED ORDER — SODIUM CHLORIDE 0.9% FLUSH: 3.0000 mL | INTRAVENOUS | Status: DC | PRN

## 2021-12-23 MED ORDER — CHLORHEXIDINE GLUCONATE 0.12 % MT SOLN
15.0000 mL | Freq: Once | OROMUCOSAL | Status: AC
  Administered 2021-12-23: 15 mL via OROMUCOSAL
  Filled 2021-12-23: qty 15

## 2021-12-23 MED ORDER — PHENYLEPHRINE HCL-NACL 20-0.9 MG/250ML-% IV SOLN
INTRAVENOUS | Status: DC | PRN
  Administered 2021-12-23: 25 ug/min via INTRAVENOUS

## 2021-12-23 MED ORDER — BUPIVACAINE HCL (PF) 0.5 % IJ SOLN
INTRAMUSCULAR | Status: DC | PRN
  Administered 2021-12-23: 10 mL
  Administered 2021-12-23: 20 mL

## 2021-12-23 MED ORDER — LIDOCAINE 2% (20 MG/ML) 5 ML SYRINGE
INTRAMUSCULAR | Status: AC
  Filled 2021-12-23: qty 5

## 2021-12-23 MED ORDER — CEFAZOLIN SODIUM-DEXTROSE 2-4 GM/100ML-% IV SOLN
2.0000 g | Freq: Three times a day (TID) | INTRAVENOUS | Status: AC
  Administered 2021-12-23 – 2021-12-24 (×2): 2 g via INTRAVENOUS
  Filled 2021-12-23 (×2): qty 100

## 2021-12-23 MED ORDER — ROCURONIUM BROMIDE 10 MG/ML (PF) SYRINGE
PREFILLED_SYRINGE | INTRAVENOUS | Status: AC
  Filled 2021-12-23: qty 20

## 2021-12-23 MED ORDER — EPHEDRINE SULFATE-NACL 50-0.9 MG/10ML-% IV SOSY
PREFILLED_SYRINGE | INTRAVENOUS | Status: DC | PRN
  Administered 2021-12-23 (×3): 5 mg via INTRAVENOUS

## 2021-12-23 MED ORDER — PROPOFOL 10 MG/ML IV BOLUS
INTRAVENOUS | Status: AC
  Filled 2021-12-23: qty 20

## 2021-12-23 MED ORDER — SENNA 8.6 MG PO TABS
1.0000 | ORAL_TABLET | Freq: Two times a day (BID) | ORAL | Status: DC
  Administered 2021-12-23 – 2021-12-25 (×4): 8.6 mg via ORAL
  Filled 2021-12-23 (×4): qty 1

## 2021-12-23 MED ORDER — CEFAZOLIN SODIUM 1 G IJ SOLR
INTRAMUSCULAR | Status: AC
  Filled 2021-12-23: qty 30

## 2021-12-23 MED ORDER — LIDOCAINE-EPINEPHRINE 1 %-1:100000 IJ SOLN
INTRAMUSCULAR | Status: AC
  Filled 2021-12-23: qty 1

## 2021-12-23 MED ORDER — ACETAMINOPHEN 650 MG RE SUPP: 650.0000 mg | RECTAL | Status: DC | PRN

## 2021-12-23 MED ORDER — ATORVASTATIN CALCIUM 40 MG PO TABS
40.0000 mg | ORAL_TABLET | Freq: Every day | ORAL | Status: DC
  Administered 2021-12-23 – 2021-12-24 (×2): 40 mg via ORAL
  Filled 2021-12-23 (×2): qty 1

## 2021-12-23 MED ORDER — ACETAMINOPHEN 500 MG PO TABS
1000.0000 mg | ORAL_TABLET | Freq: Once | ORAL | Status: AC
  Administered 2021-12-23: 1000 mg via ORAL
  Filled 2021-12-23: qty 2

## 2021-12-23 MED ORDER — FENTANYL CITRATE (PF) 250 MCG/5ML IJ SOLN
INTRAMUSCULAR | Status: AC
  Filled 2021-12-23: qty 5

## 2021-12-23 MED ORDER — MORPHINE SULFATE (PF) 2 MG/ML IV SOLN: 2.0000 mg | INTRAVENOUS | Status: DC | PRN

## 2021-12-23 MED ORDER — SODIUM CHLORIDE 0.9 % IV SOLN
250.0000 mL | INTRAVENOUS | Status: DC
  Administered 2021-12-23: 250 mL via INTRAVENOUS

## 2021-12-23 MED ORDER — BISACODYL 10 MG RE SUPP: 10.0000 mg | Freq: Every day | RECTAL | Status: DC | PRN

## 2021-12-23 MED ORDER — SUGAMMADEX SODIUM 200 MG/2ML IV SOLN
INTRAVENOUS | Status: DC | PRN
  Administered 2021-12-23: 200 mg via INTRAVENOUS

## 2021-12-23 MED ORDER — PHENOL 1.4 % MT LIQD: 1.0000 | OROMUCOSAL | Status: DC | PRN

## 2021-12-23 MED ORDER — ONDANSETRON HCL 4 MG/2ML IJ SOLN
INTRAMUSCULAR | Status: DC | PRN
  Administered 2021-12-23: 4 mg via INTRAVENOUS

## 2021-12-23 MED ORDER — OXYCODONE HCL 5 MG PO TABS: 5.0000 mg | ORAL_TABLET | Freq: Once | ORAL | Status: DC | PRN

## 2021-12-23 MED ORDER — FENTANYL CITRATE (PF) 100 MCG/2ML IJ SOLN: 25.0000 ug | INTRAMUSCULAR | Status: DC | PRN

## 2021-12-23 MED ORDER — CEFAZOLIN IN SODIUM CHLORIDE 3-0.9 GM/100ML-% IV SOLN
3.0000 g | INTRAVENOUS | Status: AC
  Administered 2021-12-23 (×2): 3 g via INTRAVENOUS
  Filled 2021-12-23: qty 100

## 2021-12-23 MED ORDER — 0.9 % SODIUM CHLORIDE (POUR BTL) OPTIME
TOPICAL | Status: DC | PRN
  Administered 2021-12-23: 1000 mL

## 2021-12-23 MED ORDER — MENTHOL 3 MG MT LOZG: 1.0000 | LOZENGE | OROMUCOSAL | Status: DC | PRN

## 2021-12-23 MED ORDER — LORATADINE 10 MG PO TABS
10.0000 mg | ORAL_TABLET | Freq: Every day | ORAL | Status: DC
  Administered 2021-12-23 – 2021-12-24 (×2): 10 mg via ORAL
  Filled 2021-12-23 (×3): qty 1

## 2021-12-23 MED ORDER — ROCURONIUM BROMIDE 10 MG/ML (PF) SYRINGE
PREFILLED_SYRINGE | INTRAVENOUS | Status: AC
  Filled 2021-12-23: qty 10

## 2021-12-23 MED ORDER — LIDOCAINE-EPINEPHRINE 1 %-1:100000 IJ SOLN
INTRAMUSCULAR | Status: DC | PRN
  Administered 2021-12-23: 10 mL

## 2021-12-23 MED ORDER — ACETAMINOPHEN 325 MG PO TABS
650.0000 mg | ORAL_TABLET | ORAL | Status: DC | PRN
  Administered 2021-12-23 – 2021-12-25 (×6): 650 mg via ORAL
  Filled 2021-12-23 (×6): qty 2

## 2021-12-23 MED ORDER — ALBUMIN HUMAN 5 % IV SOLN: INTRAVENOUS | Status: DC | PRN

## 2021-12-23 SURGICAL SUPPLY — 79 items
BAG COUNTER SPONGE SURGICOUNT (BAG) ×1 IMPLANT
BASKET BONE COLLECTION (BASKET) ×1 IMPLANT
BLADE BONE MILL MEDIUM (MISCELLANEOUS) ×1 IMPLANT
BLADE CLIPPER SURG (BLADE) IMPLANT
BONE CHIP PRESERV 40CC PCAN1/2 (Bone Implant) ×1 IMPLANT
BUR MATCHSTICK NEURO 3.0 LAGG (BURR) ×1 IMPLANT
CAGE COROENT LG 10X9X23-12 (Cage) IMPLANT
CAGE COROENT PLIF 10X28-8 LUMB (Cage) IMPLANT
CANISTER SUCT 3000ML PPV (MISCELLANEOUS) ×1 IMPLANT
CEMENT KYPHON C01A KIT/MIXER (Cement) IMPLANT
CNTNR URN SCR LID CUP LEK RST (MISCELLANEOUS) ×1 IMPLANT
CONT SPEC 4OZ STRL OR WHT (MISCELLANEOUS) ×1
COVER BACK TABLE 60X90IN (DRAPES) ×1 IMPLANT
DERMABOND ADVANCED .7 DNX12 (GAUZE/BANDAGES/DRESSINGS) ×1 IMPLANT
DEVICE DISSECT PLASMABLAD 3.0S (MISCELLANEOUS) ×1 IMPLANT
DRAPE C-ARM 42X72 X-RAY (DRAPES) ×2 IMPLANT
DRAPE C-ARMOR (DRAPES) IMPLANT
DRAPE HALF SHEET 40X57 (DRAPES) IMPLANT
DRAPE LAPAROTOMY 100X72X124 (DRAPES) ×1 IMPLANT
DRAPE WARM FLUID 44X44 (DRAPES) IMPLANT
DRSG OPSITE POSTOP 4X8 (GAUZE/BANDAGES/DRESSINGS) IMPLANT
DURAPREP 26ML APPLICATOR (WOUND CARE) ×1 IMPLANT
DURASEAL APPLICATOR TIP (TIP) IMPLANT
DURASEAL SPINE SEALANT 3ML (MISCELLANEOUS) IMPLANT
ELECT REM PT RETURN 9FT ADLT (ELECTROSURGICAL) ×1
ELECTRODE REM PT RTRN 9FT ADLT (ELECTROSURGICAL) ×1 IMPLANT
GAUZE 4X4 16PLY ~~LOC~~+RFID DBL (SPONGE) IMPLANT
GAUZE SPONGE 4X4 12PLY STRL (GAUZE/BANDAGES/DRESSINGS) ×1 IMPLANT
GLOVE BIOGEL PI IND STRL 7.0 (GLOVE) IMPLANT
GLOVE BIOGEL PI IND STRL 7.5 (GLOVE) IMPLANT
GLOVE BIOGEL PI IND STRL 8.5 (GLOVE) ×2 IMPLANT
GLOVE ECLIPSE 7.5 STRL STRAW (GLOVE) IMPLANT
GLOVE ECLIPSE 8.5 STRL (GLOVE) ×2 IMPLANT
GLOVE SURG SS PI 6.5 STRL IVOR (GLOVE) IMPLANT
GOWN STRL REUS W/ TWL LRG LVL3 (GOWN DISPOSABLE) IMPLANT
GOWN STRL REUS W/ TWL XL LVL3 (GOWN DISPOSABLE) IMPLANT
GOWN STRL REUS W/TWL 2XL LVL3 (GOWN DISPOSABLE) ×2 IMPLANT
GOWN STRL REUS W/TWL LRG LVL3 (GOWN DISPOSABLE) ×4
GOWN STRL REUS W/TWL XL LVL3 (GOWN DISPOSABLE) ×2
GRAFT BNE CANC CHIPS 1-8 40CC (Bone Implant) IMPLANT
GRAFT BONE PROTEIOS XL 10CC (Orthopedic Implant) IMPLANT
HEMOSTAT POWDER KIT SURGIFOAM (HEMOSTASIS) ×1 IMPLANT
KIT BASIN OR (CUSTOM PROCEDURE TRAY) ×1 IMPLANT
KIT GRAFTMAG DEL NEURO DISP (NEUROSURGERY SUPPLIES) IMPLANT
KIT TURNOVER KIT B (KITS) ×1 IMPLANT
MARKER SKIN DUAL TIP RULER LAB (MISCELLANEOUS) IMPLANT
MILL BONE PREP (MISCELLANEOUS) ×1 IMPLANT
NDL RELINE-OR FENS 16 (NEEDLE) IMPLANT
NDL SPNL 18GX3.5 QUINCKE PK (NEEDLE) IMPLANT
NEEDLE HYPO 22GX1.5 SAFETY (NEEDLE) ×1 IMPLANT
NEEDLE RELINE-OR FENS 16 (NEEDLE) ×6 IMPLANT
NEEDLE SPNL 18GX3.5 QUINCKE PK (NEEDLE) IMPLANT
NS IRRIG 1000ML POUR BTL (IV SOLUTION) ×1 IMPLANT
PACK LAMINECTOMY NEURO (CUSTOM PROCEDURE TRAY) ×1 IMPLANT
PAD ARMBOARD 7.5X6 YLW CONV (MISCELLANEOUS) ×3 IMPLANT
PATTIES SURGICAL .5 X1 (DISPOSABLE) ×1 IMPLANT
PATTIES SURGICAL 1X1 (DISPOSABLE) IMPLANT
PLASMABLADE 3.0S (MISCELLANEOUS) ×1
PUSHER RELINE FENS 36 OR (MISCELLANEOUS) IMPLANT
ROD RELIN-O LORD 5.5X65MM (Rod) IMPLANT
SCREW LOCK RELINE 5.5 TULIP (Screw) IMPLANT
SCREW RELINE PA 6.5X45 (Screw) IMPLANT
SPIKE FLUID TRANSFER (MISCELLANEOUS) ×1 IMPLANT
SPONGE SURGIFOAM ABS GEL 100 (HEMOSTASIS) IMPLANT
SPONGE T-LAP 4X18 ~~LOC~~+RFID (SPONGE) IMPLANT
SUT PROLENE 6 0 BV (SUTURE) IMPLANT
SUT VIC AB 1 CT1 18XBRD ANBCTR (SUTURE) ×1 IMPLANT
SUT VIC AB 1 CT1 8-18 (SUTURE) ×1
SUT VIC AB 2-0 CP2 18 (SUTURE) ×1 IMPLANT
SUT VIC AB 3-0 SH 8-18 (SUTURE) ×1 IMPLANT
SUT VIC AB 4-0 RB1 18 (SUTURE) ×1 IMPLANT
SYR 3ML LL SCALE MARK (SYRINGE) ×4 IMPLANT
SYR 5ML LL (SYRINGE) IMPLANT
TIP CART INSTAFILL GL 5CC (ORTHOPEDIC DISPOSABLE SUPPLIES) IMPLANT
TIP FENS REPLACE RELINE (MISCELLANEOUS) IMPLANT
TOWEL GREEN STERILE (TOWEL DISPOSABLE) ×1 IMPLANT
TOWEL GREEN STERILE FF (TOWEL DISPOSABLE) ×1 IMPLANT
TRAY FOLEY MTR SLVR 16FR STAT (SET/KITS/TRAYS/PACK) ×1 IMPLANT
WATER STERILE IRR 1000ML POUR (IV SOLUTION) ×1 IMPLANT

## 2021-12-23 NOTE — Progress Notes (Signed)
Orthopedic Tech Progress Note Patient Details:  Danielle Frey September 22, 1950 [id number]  Ortho Devices Type of Ortho Device: Lumbar corsett Ortho Device/Splint Location: BACK Ortho Device/Splint Interventions: Ordered   Post Interventions Patient Tolerated: Well Instructions Provided: Care of Diaz 12/23/2021, 6:12 PM

## 2021-12-23 NOTE — Anesthesia Postprocedure Evaluation (Signed)
Anesthesia Post Note  Patient: Danielle Frey  Procedure(s) Performed: Lumbar Three-Four/Lumbar Four-Five Posterior Lumbar Interbody Fusion/Methcrylate augmentation (Back)     Patient location during evaluation: PACU Anesthesia Type: General Level of consciousness: awake and alert Pain management: pain level controlled Vital Signs Assessment: post-procedure vital signs reviewed and stable Respiratory status: spontaneous breathing, nonlabored ventilation and respiratory function stable Cardiovascular status: stable and blood pressure returned to baseline Anesthetic complications: no   No notable events documented.  Last Vitals:  Vitals:   12/23/21 1545 12/23/21 1614  BP: 96/62 98/63  Pulse: 87 87  Resp: 15 18  Temp: 37.2 C 36.7 C  SpO2: 94% 94%    Last Pain:  Vitals:   12/23/21 1614  TempSrc: Oral  PainSc:                  Audry Pili

## 2021-12-23 NOTE — Transfer of Care (Signed)
Immediate Anesthesia Transfer of Care Note  Patient: Danielle Frey  Procedure(s) Performed: Lumbar Three-Four/Lumbar Four-Five Posterior Lumbar Interbody Fusion/Methcrylate augmentation (Back)  Patient Location: PACU  Anesthesia Type:General  Level of Consciousness: drowsy and patient cooperative  Airway & Oxygen Therapy: Patient Spontanous Breathing and Patient connected to nasal cannula oxygen  Post-op Assessment: Report given to RN and Post -op Vital signs reviewed and stable  Post vital signs: Reviewed and stable  Last Vitals:  Vitals Value Taken Time  BP 120/67 12/23/21 1449  Temp    Pulse 88 12/23/21 1451  Resp 12 12/23/21 1451  SpO2 94 % 12/23/21 1451  Vitals shown include unvalidated device data.  Last Pain:  Vitals:   12/23/21 0615  TempSrc: Oral  PainSc:       Patients Stated Pain Goal: 3 (63/87/56 4332)  Complications: No notable events documented.

## 2021-12-23 NOTE — H&P (Signed)
Danielle Frey is an 71 y.o. female.   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  She has marked weakness in the distal lower extremities.  She has been advised regarding surgical intervention and the risks thereof including risk secondary to her significant obesity.  At this point she has become nonambulatory and she is willing to accept the risks and effort to gain some ability to improve her ambulatory status.  She is undergoing posterior decompression at L3-4 and L4-5 with posterior interbody arthrodesis and pedicle screw fixation.  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 Her smoking use included cigarettes. She has a 25.00 pack-year smoking history. She has never used smokeless tobacco. She reports that she does not drink alcohol and does not use drugs.  Allergies: No Known Allergies  Medications Prior to Admission  Medication Sig Dispense Refill   atorvastatin (LIPITOR) 40 MG tablet Take 40 mg by mouth at bedtime.     Ca Carbonate-Mag Hydroxide (ROLAIDS PO) Take 1-2 tablets by mouth daily as needed (indigestion).     cetirizine (ZYRTEC) 10 MG tablet Take 10 mg by mouth at bedtime.      Cholecalciferol (VITAMIN D3) 10 MCG (400 UNIT) tablet Take 400 Units by mouth at bedtime.     clopidogrel (PLAVIX) 75 MG tablet Take 75 mg by mouth at bedtime.     Cyanocobalamin (VITAMIN B-12 PO) Take 1 tablet by mouth daily.     donepezil (ARICEPT) 5 MG tablet Take 5 mg by mouth at bedtime.     naproxen sodium (ALEVE) 220 MG tablet Take 220 mg by mouth daily as needed (pain.).     triamcinolone ointment (KENALOG) 0.5 % Apply 1 Application topically 2 (two) times daily.      Results for orders placed or performed during the hospital encounter of 12/23/21 (from the past 48 hour(s))  ABO/Rh     Status: None (Preliminary result)   Collection Time: 12/23/21  6:59 AM  Result Value Ref Range   ABO/RH(D) PENDING    No results found.  Review of Systems  Constitutional:  Positive for activity change.  Musculoskeletal:  Positive for back pain and gait problem.  Neurological:  Positive for weakness and numbness.  All other systems reviewed and are negative.   Blood pressure (!) 167/100, pulse 73, temperature 98.9 F (37.2 C), temperature source Oral, resp. rate 18, height '5\' 6"'$  (1.676 m), weight 122.5 kg, SpO2 93 %. Physical Exam Constitutional:      Appearance: Normal appearance. She is obese.  HENT:     Head: Normocephalic and atraumatic.  Right Ear: Tympanic membrane, ear canal and external ear normal.     Left Ear: Tympanic membrane, ear canal and external ear normal.     Nose: Nose normal.     Mouth/Throat:     Mouth: Mucous membranes are moist.     Pharynx: Oropharynx is clear.  Eyes:     Extraocular Movements: Extraocular movements intact.     Conjunctiva/sclera: Conjunctivae normal.     Pupils: Pupils are equal, round, and reactive to light.  Cardiovascular:     Rate and Rhythm: Normal rate and regular rhythm.     Pulses: Normal pulses.     Heart sounds: Normal heart sounds.  Pulmonary:     Effort: Pulmonary effort is normal.     Breath sounds: Normal breath sounds.   Abdominal:     General: Abdomen is flat. Bowel sounds are normal.     Palpations: Abdomen is soft.  Musculoskeletal:     Cervical back: Normal range of motion and neck supple.     Comments: Positive straight leg raising bilaterally at 30 degrees Patrick's maneuver negative  Neurological:     Mental Status: She is alert. Mental status is at baseline.     Comments: Significant weakness in the tibialis anterior and gastrocs at 4 out of 5 bilaterally quadricep strength is 4 out of 5 also absent reflexes distally in the lower extremities.  Cranial nerve examination is normal.  Upper extremity strength is intact  Psychiatric:        Mood and Affect: Mood normal.        Behavior: Behavior normal.        Thought Content: Thought content normal.        Judgment: Judgment normal.      Assessment/Plan On the listhesis L3-4 and L4-5 with severe stenosis neurogenic claudication, lumbar radiculopathy.  Plan: Posterior decompression with posterior lumbar interbody arthrodesis L3-4 and L4-5.  [name], MD 12/23/2021, 7:46 AM

## 2021-12-23 NOTE — Op Note (Signed)
Date of surgery: 12/23/2021 Preoperative diagnosis: Spondylolisthesis with stenosis L3-4 and L4-5 with neurogenic claudication, lumbar radiculopathy Postoperative diagnosis: Same Procedure: Posterior decompression via laminectomy at L3-4 and L4-5 with more work than required for simple interbody technique.  Posterior lumbar interbody arthrodesis L3-4 and L4-5 using peek spacers local autograft allograft and Protei-os.  Mentation of pedicle screws with methacrylate.  Fluoroscopic guidance.  For lateral arthrodesis with local autograft allograft and Protei-os. Surgeon: Kristeen Miss First Assistant: Duffy Rhody, MD Anesthesia: General endotracheal Indications: Danielle Frey is a 71 year old individual who has had significant back and bilateral lower extremity pain weakness for over a years period of time.  She is undergone comprehensive conservative management was found to have a degenerative spondylolisthesis at L3-4 and L4-5 with a high-grade stenosis.  She is advised regarding the need for surgical decompression and stabilization of her lumbar spine.  Procedure: The patient was brought to the operating room supine on the stretcher.  After the smooth induction of general endotracheal anesthesia Foley catheter was placed and she was carefully turned to the prone position.  The back was prepped with alcohol DuraPrep and draped in a sterile fashion.  Fluoroscopic guidance was used to map out the areas of L3-L4 and L5.  A vertical incision was then made in the skin after infiltrating with lidocaine with epinephrine mixed 50-50 with half percent Marcaine.  The dissection was carried down to the lumbodorsal fascia and the fascia was opened on either side of midline to expose the spinous processes of L3-L4 and L5.  The dissection was then taken out laterally to expose markedly hypertrophied facets at L3-4 and L4-5.  These were verified with a second radiograph and then a laminectomy was created removing the  inferior margin lamina of L4 out to the mesial wall of the facet including the entirety of the facet joint.  Thickened redundant yellow ligament was removed in this area to expose the common dural tube superiorly we identified the path for the L4 nerve root inferiorly we identified the path for the L5 nerve root each of the nerve roots was decompressed and then the disc space was isolated.  This was then done at L3-4 on the right side also.  Then we switched over to the left side and did laminotomies and foraminotomies to decompress the path of the L4and L3 nerve root at L3-4 and then a similar laminotomy was created out at L4-5 decompress the path of the L4 nerve root superiorly and L5 nerve root inferiorly each time isolating the disc space.  Discectomies were then performed using long deep instruments as the patient was morbidly obese.  A complete discectomy was carried out at L3-4 and L4-5 and L3-4 and was decided then to place spacers that measured 10 x 9 x 23 mm with a 12 degree lordosis this was done partially to help reduce to the spondylolisthesis and also to open the lateral recesses.  A total of 12 cc of bone graft was packed into the interspace at L3-4 this was a combination of autograft allograft and Prody os.  We then turned our attention to the L4-5 space where a similar discectomy was carried out and here a 10 x 9 x 23 mm spacer with 8 degrees of lordosis was placed along with 15 cc of bone graft.  Once the grafting was completed in the interspaces pedicle entry sites were chosen at L3-L4 and L5 these were verified radiographically and 6.5 x 45 mm screws were placed into each of  the pedicle holes bone was notably soft and for this reason methacrylate augmentation was carried out with 1 cc of methacrylate being injected into each screw.  Once this was accomplished the lateral gutters which had previously been decorticated were packed with autologous bone graft mixed with the Prody os and a total of 9  cc of bone graft was packed into each lateral gutter.  The screws were connected with 65 mm 5.5 mm rods with a standard curve.  The rods were tightened in a neutral construct and when completed final radiographs were obtained in [facility] and lateral projection these demonstrated good placement of the screws and the hardware with the methacrylate augmentation.  Then hemostasis in the soft tissues was obtained meticulously should be noted that during the entire case there is a considerable amount of ooze likely from the patient having been on Plavix although this was to have been stopped 1 week before.  A total blood loss for the procedure was estimated at  1300 cc and 460 cc of Cell Saver blood was returned to the patient.  Sponge instrument needle counts were all correct at the end of the case and the patient was then returned to recovery room in stable condition after closing the wound with #1 Vicryl in the lumbodorsal fascia 2-0 Vicryl in the subcutaneous tissues 3-0 Vicryl subcuticularly with a final closure of 4-0 Vicryl.

## 2021-12-23 NOTE — Anesthesia Procedure Notes (Signed)
Procedure Name: Intubation Date/Time: 12/23/2021 8:04 AM  Performed by: Colin Benton, CRNAPre-anesthesia Checklist: Patient identified, Emergency Drugs available, Suction available and Patient being monitored Patient Re-evaluated:Patient Re-evaluated prior to induction Oxygen Delivery Method: Circle system utilized Preoxygenation: Pre-oxygenation with 100% oxygen Induction Type: IV induction Ventilation: Mask ventilation without difficulty and Oral airway inserted - appropriate to patient size Laryngoscope Size: Miller and 3 Grade View: Grade I Tube type: Oral Tube size: 7.0 mm Number of attempts: 1 Airway Equipment and Method: Stylet Placement Confirmation: ETT inserted through vocal cords under direct vision, positive ETCO2 and breath sounds checked- equal and bilateral Secured at: 21 cm Tube secured with: Tape Dental Injury: Teeth and Oropharynx as per pre-operative assessment

## 2021-12-24 LAB — CBC
HCT: 27.6 % — ABNORMAL LOW (ref 36.0–46.0)
Hemoglobin: 9.1 g/dL — ABNORMAL LOW (ref 12.0–15.0)
MCH: 31.2 pg (ref 26.0–34.0)
MCHC: 33 g/dL (ref 30.0–36.0)
MCV: 94.5 fL (ref 80.0–100.0)
Platelets: 166 10*3/uL (ref 150–400)
RBC: 2.92 MIL/uL — ABNORMAL LOW (ref 3.87–5.11)
RDW: 13.8 % (ref 11.5–15.5)
WBC: 13.9 10*3/uL — ABNORMAL HIGH (ref 4.0–10.5)
nRBC: 0 % (ref 0.0–0.2)

## 2021-12-24 LAB — BASIC METABOLIC PANEL
Anion gap: 7 (ref 5–15)
BUN: 14 mg/dL (ref 8–23)
CO2: 22 mmol/L (ref 22–32)
Calcium: 8.3 mg/dL — ABNORMAL LOW (ref 8.9–10.3)
Chloride: 104 mmol/L (ref 98–111)
Creatinine, Ser: 0.99 mg/dL (ref 0.44–1.00)
GFR, Estimated: >60 mL/min (ref >60)
Glucose, Bld: 180 mg/dL — ABNORMAL HIGH (ref 70–99)
Potassium: 3.6 mmol/L (ref 3.5–5.1)
Sodium: 133 mmol/L — ABNORMAL LOW (ref 135–145)

## 2021-12-24 MED FILL — Sodium Chloride Irrigation Soln 0.9%: Qty: 3000 | Status: AC

## 2021-12-24 MED FILL — Heparin Sodium (Porcine) Inj 1000 Unit/ML: INTRAMUSCULAR | Qty: 30 | Status: AC

## 2021-12-24 MED FILL — Thrombin For Soln 5000 Unit: CUTANEOUS | Qty: 5000 | Status: AC

## 2021-12-24 MED FILL — Sodium Chloride IV Soln 0.9%: INTRAVENOUS | Qty: 1000 | Status: AC

## 2021-12-24 NOTE — Progress Notes (Signed)
Patient ID: Danielle Frey, female   DOB: 03/21/1950, 71 y.o.   MRN: [id number] Vital signs are stable though patient's blood pressure was noted to be a little low yesterday.  She did feel a bit faint but now has been ambulating this morning.  She feels that her legs are feeling better and the pain is largely gone from the legs but her back is sore.  She is able to stand a bit straighter although she still pitches forward when she walks forward.  Her incision is clean and dry and her laboratories reveal that she is anemic with a hemoglobin of 9 which she will not likely require blood transfusion at this time.  Electrolytes are appropriate considering her glucose is slightly elevated at 180 and sodium is decreased at 133.  Renal function appears good creatinine is 0.9.  We will observe her situation today and allow her further recovery if things are improving steadily she may be able to be discharged tomorrow.  She will likely require a walker for home use but I believe she has one at home.

## 2021-12-24 NOTE — Evaluation (Signed)
Physical Therapy Evaluation  Patient Details Name: Danielle Frey MRN: [id number] DOB: December 05, 1950 Today's Date: 12/24/2021  History of Present Illness  Pt is a 71 y.o. female s/p lumbar 3-5 PLIF/methcrylate augmentation. PMH significant for aneurysm of aortic root, HLD< spinal stenosis, TIA, R total knee replacement, and spinal surgery.   Clinical Impression  Pt admitted with above diagnosis. At the time of PT eval, pt was able to demonstrate transfers and ambulation with gross min guard assist and RW for support. Pt was educated on precautions, brace application/wearing schedule, appropriate activity progression, and car transfer. Pt currently with functional limitations due to the deficits listed below (see PT Problem List). Pt will benefit from skilled PT to increase their independence and safety with mobility to allow discharge to the venue listed below.         Recommendations for follow up therapy are one component of a multi-disciplinary discharge planning process, led by the attending physician.  Recommendations may be updated based on patient status, additional functional criteria and insurance authorization.  Follow Up Recommendations No PT follow up      Assistance Recommended at Discharge PRN  Patient can return home with the following  A little help with walking and/or transfers;A little help with bathing/dressing/bathroom;Assist for transportation;Help with stairs or ramp for entrance    Equipment Recommendations Rolling walker (2 wheels);BSC/3in1  Recommendations for Other Services       Functional Status Assessment Patient has had a recent decline in their functional status and demonstrates the ability to make significant improvements in function in a reasonable and predictable amount of time.     Precautions / Restrictions Precautions Precautions: Back Precaution Booklet Issued: Yes (comment) Precaution Comments: Reviewed handout and pt was cued for precautions  during functional mobility Required Braces or Orthoses: Spinal Brace Spinal Brace: Lumbar corset;Applied in sitting position Restrictions Weight Bearing Restrictions: No      Mobility  Bed Mobility               General bed mobility comments: Pt was received EOB in room with OT    Transfers Overall transfer level: Needs assistance Equipment used: Rolling walker (2 wheels) Transfers: Sit to/from Stand Sit to Stand: Min guard           General transfer comment: VC's for hand placement on seated surface for safety. No assist required.    Ambulation/Gait Ambulation/Gait assistance: Min guard Gait Distance (Feet): 400 Feet Assistive device: Rolling walker (2 wheels) Gait Pattern/deviations: Step-through pattern, Decreased stride length, Decreased dorsiflexion - right, Trunk flexed, Narrow base of support Gait velocity: Decreased Gait velocity interpretation: <1.31 ft/sec, indicative of household ambulator   General Gait Details: VC's for improved posture, closer walker proximity, and forward gaze. No overt LOB noted however difficulty maintaining upright posture throughout.  Stairs            Wheelchair Mobility    Modified Rankin (Stroke Patients Only)       Balance Overall balance assessment: Mild deficits observed, not formally tested                                           Pertinent Vitals/Pain Pain Assessment Pain Assessment: Faces Faces Pain Scale: Hurts little more Pain Location: operative site Pain Descriptors / Indicators: Discomfort, Operative site guarding Pain Intervention(s): Limited activity within patient's tolerance, Monitored during session, Repositioned    Home  Living Family/patient expects to be discharged to:: Private residence Living Arrangements: Spouse/significant other Available Help at Discharge: Family (Pt reports don and daughter in law live across the street and daughter in law does not work, thus, can  be available to assist at any time.) Type of Home: Mobile home Home Access: Stairs to enter Entrance Stairs-Rails: Can reach both;Left;Right Entrance Stairs-Number of Steps: 15 in front; 4 or 5 steps in the back where she goes in at with hand rails on both sides.   Home Layout: One level Home Equipment: Cane - single point;Adaptive equipment      Prior Function Prior Level of Function : Independent/Modified Independent;History of Falls (last six months)             Mobility Comments: per pt she was recently heavily relying on cane and has had several falls due to R foot drop ADLs Comments: Independent in ADL and most IADL per pt report     Hand Dominance   Dominant Hand: Left    Extremity/Trunk Assessment   Upper Extremity Assessment Upper Extremity Assessment: Defer to OT evaluation    Lower Extremity Assessment Lower Extremity Assessment: RLE deficits/detail RLE Deficits / Details: Mild foot drop noted. Appears improved since surgery.    Cervical / Trunk Assessment Cervical / Trunk Assessment: Back Surgery  Communication   Communication: No difficulties  Cognition Arousal/Alertness: Awake/alert Behavior During Therapy: WFL for tasks assessed/performed Overall Cognitive Status: Within Functional Limits for tasks assessed                                          General Comments General comments (skin integrity, edema, etc.): VSS.    Exercises     Assessment/Plan    PT Assessment Patient needs continued PT services  PT Problem List Decreased strength;Decreased activity tolerance;Decreased balance;Decreased mobility;Decreased knowledge of use of DME;Decreased safety awareness;Decreased knowledge of precautions;Pain       PT Treatment Interventions DME instruction;Gait training;Stair training;Functional mobility training;Therapeutic activities;Therapeutic exercise;Balance training;Patient/family education    PT Goals (Current goals can be  found in the Care Plan section)  Acute Rehab PT Goals Patient Stated Goal: Return home PT Goal Formulation: With patient Time For Goal Achievement: 12/31/21 Potential to Achieve Goals: Good    Frequency Min 5X/week     Co-evaluation               AM-PAC PT "6 Clicks" Mobility  Outcome Measure Help needed turning from your back to your side while in a flat bed without using bedrails?: None Help needed moving from lying on your back to sitting on the side of a flat bed without using bedrails?: A Little Help needed moving to and from a bed to a chair (including a wheelchair)?: A Little Help needed standing up from a chair using your arms (e.g., wheelchair or bedside chair)?: A Little Help needed to walk in hospital room?: A Little Help needed climbing 3-5 steps with a railing? : A Little 6 Click Score: 19    End of Session Equipment Utilized During Treatment: Gait belt;Back brace Activity Tolerance: Patient tolerated treatment well Patient left: in chair;with call bell/phone within reach Nurse Communication: Mobility status PT Visit Diagnosis: Unsteadiness on feet (R26.81);Pain Pain - part of body:  (back)    Time: 7035-0093 PT Time Calculation (min) (ACUTE ONLY): 29 min   Charges:   PT Evaluation $PT Eval Low  Complexity: 1 Low PT Treatments $Gait Training: 8-22 mins        [name], PT, DPT Acute Rehabilitation Services Secure Chat Preferred Office: [phone]   [name] 12/24/2021, 10:19 AM

## 2021-12-24 NOTE — Evaluation (Signed)
Occupational Therapy Evaluation Patient Details Name: Danielle Frey MRN: [id number] DOB: 1950-05-08 Today's Date: 12/24/2021   History of Present Illness Pt is a 71 y.o. female s/p lumbar 3-5 PLIF/methcrylate augmentation. PMH significant for aneurysm of aortic root, HLD< spinal stenosis, TIA, R total knee replacement, and spinal surgery.   Clinical Impression   PTA, pt reports she lived with her husband and was independent in ADL. Pt reports son's wife will be available to help as needed after discharge. Pt currently requiring up to mod A for UB ADL for brace application, and min guard-min A for LB ADL with AE. Pt benefiting from cues during transfers. Anticipate continued progress during acute stay. Recommending discharge home with no follow up OT.    Recommendations for follow up therapy are one component of a multi-disciplinary discharge planning process, led by the attending physician.  Recommendations may be updated based on patient status, additional functional criteria and insurance authorization.   Follow Up Recommendations  No OT follow up     Assistance Recommended at Discharge Intermittent Supervision/Assistance  Patient can return home with the following A little help with walking and/or transfers;A little help with bathing/dressing/bathroom;Assistance with cooking/housework;Assist for transportation;Help with stairs or ramp for entrance    Functional Status Assessment  Patient has had a recent decline in their functional status and demonstrates the ability to make significant improvements in function in a reasonable and predictable amount of time.  Equipment Recommendations  BSC/3in1;Other (comment) (RW)    Recommendations for Other Services       Precautions / Restrictions Precautions Precautions: Back Precaution Booklet Issued: Yes (comment) Precaution Comments: Education reviewed within the context of ADL Required Braces or Orthoses: Spinal Brace Spinal Brace:  Lumbar corset Restrictions Weight Bearing Restrictions: No      Mobility Bed Mobility               General bed mobility comments: EOB on arrival and OT handoff to PT    Transfers Overall transfer level: Needs assistance Equipment used: Rolling walker (2 wheels) Transfers: Sit to/from Stand Sit to Stand: Min guard           General transfer comment: cues for technique      Balance Overall balance assessment: Mild deficits observed, not formally tested                                         ADL either performed or assessed with clinical judgement   ADL Overall ADL's : Needs assistance/impaired Eating/Feeding: Modified independent;Sitting   Grooming: Supervision/safety;Min guard;Standing   Upper Body Bathing: Set up;Sitting   Lower Body Bathing: Minimal assistance;Sit to/from stand   Upper Body Dressing : Moderate assistance;Sitting Upper Body Dressing Details (indicate cue type and reason): Mod A for brace application/new learning. Lower Body Dressing: Min guard;Sit to/from stand Lower Body Dressing Details (indicate cue type and reason): Pt able to perform LB dressing with use of AE. Min guard A due to very slow to rise. Toilet Transfer: Min guard;Ambulation;Rolling walker (2 wheels);BSC/3in1 Toilet Transfer Details (indicate cue type and reason): simulated in room   Toileting - Clothing Manipulation Details (indicate cue type and reason): Pt familiar with toilet aid   Tub/Shower Transfer Details (indicate cue type and reason): plan to review next session Functional mobility during ADLs: Min guard;Rolling walker (2 wheels) General ADL Comments: limited by BLE weakness and decreased LE mobility during  LB ADL     Vision Baseline Vision/History: 0 No visual deficits Ability to See in Adequate Light: 0 Adequate Patient Visual Report: No change from baseline Vision Assessment?: No apparent visual deficits     Perception  Perception Perception Tested?: No   Praxis Praxis Praxis tested?: Not tested    Pertinent Vitals/Pain Pain Assessment Pain Assessment: Faces Faces Pain Scale: Hurts little more Pain Location: operative site Pain Descriptors / Indicators: Discomfort, Operative site guarding Pain Intervention(s): Limited activity within patient's tolerance, Monitored during session     Hand Dominance Left   Extremity/Trunk Assessment Upper Extremity Assessment Upper Extremity Assessment: Generalized weakness   Lower Extremity Assessment Lower Extremity Assessment: Defer to PT evaluation   Cervical / Trunk Assessment Cervical / Trunk Assessment: Back Surgery   Communication Communication Communication: No difficulties   Cognition Arousal/Alertness: Awake/alert Behavior During Therapy: WFL for tasks assessed/performed Overall Cognitive Status: Within Functional Limits for tasks assessed                                 General Comments: able to recall 3/3 spinal precautions after initial education and log roll technique taught by nursing staff.     General Comments  VSS.    Exercises     Shoulder Instructions      Home Living Family/patient expects to be discharged to:: Private residence Living Arrangements: Spouse/significant other Available Help at Discharge: Family (Pt reports don and daughter in law live "across the way" and daughter in law does not work, thus, can be available to assist at any time.) Type of Home: Mobile home Home Access: Stairs to enter Entrance Stairs-Number of Steps: 15 in front; 4 or 5 steps in the back where she goes in at with hand rails on both sides. Entrance Stairs-Rails: Can reach both;Left;Right Home Layout: One level     Bathroom Shower/Tub: Teacher, early years/pre: Standard     Home Equipment: Cane - single point;Adaptive equipment Adaptive Equipment: Reacher;Other (Comment) (toilet aid)        Prior  Functioning/Environment Prior Level of Function : Independent/Modified Independent;History of Falls (last six months)             Mobility Comments: per pt she was recently heavily relying on cane and has had several falls due to R foot drop ADLs Comments: Independent in ADL and most IADL per pt report        OT Problem List: Decreased strength;Decreased range of motion;Decreased activity tolerance;Impaired balance (sitting and/or standing);Decreased knowledge of use of DME or AE;Decreased knowledge of precautions;Pain      OT Treatment/Interventions: Self-care/ADL training;Therapeutic exercise;DME and/or AE instruction;Therapeutic activities;Patient/family education;Balance training    OT Goals(Current goals can be found in the care plan section) Acute Rehab OT Goals Patient Stated Goal: no more falls OT Goal Formulation: With patient Time For Goal Achievement: 01/07/22 Potential to Achieve Goals: Good  OT Frequency: Min 2X/week    Co-evaluation              AM-PAC OT "6 Clicks" Daily Activity     Outcome Measure Help from another person eating meals?: None Help from another person taking care of personal grooming?: A Little Help from another person toileting, which includes using toliet, bedpan, or urinal?: A Little Help from another person bathing (including washing, rinsing, drying)?: A Little Help from another person to put on and taking off regular upper body clothing?: A Little  Help from another person to put on and taking off regular lower body clothing?: A Little 6 Click Score: 19   End of Session Equipment Utilized During Treatment: Gait belt;Rolling walker (2 wheels);Back brace Nurse Communication: Mobility status  Activity Tolerance: Patient tolerated treatment well Patient left: Other (comment) (In hall with PT)  OT Visit Diagnosis: Unsteadiness on feet (R26.81);Muscle weakness (generalized) (M62.81);Repeated falls (R29.6);History of falling (Z91.81);Other  abnormalities of gait and mobility (R26.89);Pain Pain - part of body:  (back)                Time: 1121-6244 OT Time Calculation (min): 27 min Charges:  OT General Charges $OT Visit: 1 Visit OT Evaluation $OT Eval Low Complexity: 1 Low OT Treatments $Self Care/Home Management : 8-22 mins  Elder Cyphers, OTR/L [facility] Acute Rehabilitation Office: [phone]   Magnus Ivan 12/24/2021, 9:15 AM

## 2021-12-25 MED ORDER — OXYCODONE-ACETAMINOPHEN 5-325 MG PO TABS: 1.0000 | ORAL_TABLET | ORAL | 0 refills | Status: AC | PRN

## 2021-12-25 MED ORDER — METHOCARBAMOL 500 MG PO TABS: 500.0000 mg | ORAL_TABLET | Freq: Four times a day (QID) | ORAL | 3 refills | Status: DC | PRN

## 2021-12-25 NOTE — Discharge Summary (Signed)
Physician Discharge Summary  Patient ID: Danielle Frey MRN: [id number] DOB/AGE: [birth year] 71 y.o.  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  Lumbar spinal stenosis.  Neurogenic claudication.  Lumbar radiculopathy.  Acute blood loss anemia Principal Problem:   Spondylolisthesis at L3-L4 level   Discharged Condition: good  Hospital Course: Patient is a 71 year old individual whose had severe spondylitic stenosis at L3-4 and L4-5.  She has had severe radicular pain and weakness in her lower extremities.  Patient was advised regarding surgical decompression which she tolerated well.  Postoperatively she had significant anemia secondary to acute blood loss.  Her hemoglobin was down to 9 but she did not require transfusion.  Preoperative hemoglobin was above 13.  She is responding well to IV fluids and oral fluids and currently is ambulatory without assistance using a walker.  She is discharged home at this time.  Consults: None  Significant Diagnostic Studies: None  Treatments: surgery: See op note  Discharge Exam: Blood pressure 102/60, pulse 85, temperature 99.8 F (37.7 C), temperature source Oral, resp. rate 18, height '5\' 6"'$  (1.676 m), weight 122.5 kg, SpO2 97 %. Incision is clean and dry Station and gait are intact with the use of a walker patient still manages significant forward stoop of 10 degrees.  Disposition: Discharge disposition: 01-Home or Self Care       Discharge Instructions     Call MD for:  redness, tenderness, or signs of infection (pain, swelling, redness, odor or Etheredge/yellow discharge around incision site)   Complete by: As directed    Call MD for:  severe uncontrolled pain   Complete by: As directed    Call MD for:  temperature >100.4   Complete by: As directed    Diet - low sodium heart  healthy   Complete by: As directed    Discharge wound care:   Complete by: As directed    Okay to shower. Do not apply salves or appointments to incision. No heavy lifting with the upper extremities greater than 10 pounds. May resume driving when not requiring pain medication and patient feels comfortable with doing so.   Incentive spirometry RT   Complete by: As directed    Increase activity slowly   Complete by: As directed       Allergies as of 12/25/2021   No Known Allergies      Medication List     TAKE these medications    atorvastatin 40 MG tablet Commonly known as: LIPITOR Take 40 mg by mouth at bedtime.   cetirizine 10 MG tablet Commonly known as: ZYRTEC Take 10 mg by mouth at bedtime.   clopidogrel 75 MG tablet Commonly known as: PLAVIX Take 75 mg by mouth at bedtime.   donepezil 5 MG tablet Commonly known as: ARICEPT Take 5 mg by mouth at bedtime.   methocarbamol 500 MG tablet Commonly known as: ROBAXIN Take 1 tablet (500 mg total) by mouth every 6 (six) hours as needed for muscle spasms.   naproxen sodium 220 MG tablet Commonly known as: ALEVE Take 220 mg by mouth daily as needed (pain.).   oxyCODONE-acetaminophen 5-325 MG tablet Commonly known as: Percocet Take 1 tablet by mouth every 4 (four) hours as needed for severe pain.   ROLAIDS PO Take 1-2 tablets by mouth daily as needed (indigestion).   triamcinolone ointment 0.5 % Commonly known as: KENALOG Apply 1 Application topically  2 (two) times daily.   VITAMIN B-12 PO Take 1 tablet by mouth daily.   Vitamin D3 10 MCG (400 UNIT) tablet Take 400 Units by mouth at bedtime.               Discharge Care Instructions  (From admission, onward)           Start     Ordered   12/25/21 0000  Discharge wound care:       Comments: Okay to shower. Do not apply salves or appointments to incision. No heavy lifting with the upper extremities greater than 10 pounds. May resume driving when  not requiring pain medication and patient feels comfortable with doing so.   12/25/21 1344             Signed: Blanchie Dessert Jermisha Hoffart 12/25/2021, 1:44 PM

## 2021-12-25 NOTE — Plan of Care (Signed)
  Problem: Education: Goal: Ability to verbalize activity precautions or restrictions will improve Outcome: Completed/Met Goal: Knowledge of the prescribed therapeutic regimen will improve Outcome: Completed/Met Goal: Understanding of discharge needs will improve Outcome: Completed/Met   

## 2021-12-25 NOTE — Progress Notes (Signed)
Physical Therapy Treatment  Patient Details Name: Danielle Frey MRN: [id number] DOB: 1950-08-07 Today's Date: 12/25/2021   History of Present Illness Pt is a 71 y.o. female s/p lumbar 3-5 PLIF/methcrylate augmentation. PMH significant for aneurysm of aortic root, HLD< spinal stenosis, TIA, R total knee replacement, and spinal surgery.    PT Comments    Pt progressing with post-op mobility. She was able to demonstrate transfers and ambulation with gross min guard assist and RW for support. Pt was educated on precautions, brace application/wearing schedule, appropriate activity progression, and car transfer. Will continue to follow.     Recommendations for follow up therapy are one component of a multi-disciplinary discharge planning process, led by the attending physician.  Recommendations may be updated based on patient status, additional functional criteria and insurance authorization.  Follow Up Recommendations  No PT follow up     Assistance Recommended at Discharge PRN  Patient can return home with the following A little help with walking and/or transfers;A little help with bathing/dressing/bathroom;Assist for transportation;Help with stairs or ramp for entrance   Equipment Recommendations  Rolling walker (2 wheels);BSC/3in1    Recommendations for Other Services       Precautions / Restrictions Precautions Precautions: Back Precaution Booklet Issued: Yes (comment) Precaution Comments: Reviewed handout and pt was cued for precautions during functional mobility Required Braces or Orthoses: Spinal Brace Spinal Brace: Lumbar corset;Applied in sitting position Restrictions Weight Bearing Restrictions: No     Mobility  Bed Mobility Overal bed mobility: Needs Assistance Bed Mobility: Rolling, Sidelying to Sit, Sit to Sidelying Rolling: Min guard Sidelying to sit: Min guard     Sit to sidelying: Min assist General bed mobility comments: Assist for LE elevation back up  into bed at end of session. HOB flat and rails lowered to simulate home environment. Log roll was effortful and difficult to maintain proper form due to increased body habitus. Pt reports she can sleep in the recliner if needbe at home.    Transfers Overall transfer level: Needs assistance Equipment used: Rolling walker (2 wheels) Transfers: Sit to/from Stand Sit to Stand: Min guard           General transfer comment: VC's for hand placement on seated surface for safety. No assist required.    Ambulation/Gait Ambulation/Gait assistance: Min guard Gait Distance (Feet): 100 Feet Assistive device: Rolling walker (2 wheels) Gait Pattern/deviations: Step-through pattern, Decreased stride length, Decreased dorsiflexion - right, Trunk flexed, Narrow base of support Gait velocity: Decreased Gait velocity interpretation: <1.31 ft/sec, indicative of household ambulator   General Gait Details: Pt was received ambulating in the hallway with son present. Pt required cues for improved posture, closer walker proximity, and forward gaze. Pt moving slow but generally steady with RW for support.   Stairs             Wheelchair Mobility    Modified Rankin (Stroke Patients Only)       Balance Overall balance assessment: Mild deficits observed, not formally tested                                          Cognition Arousal/Alertness: Awake/alert Behavior During Therapy: WFL for tasks assessed/performed Overall Cognitive Status: Within Functional Limits for tasks assessed  Exercises      General Comments General comments (skin integrity, edema, etc.): RN reporting pt with light fever this morning but okay to see.      Pertinent Vitals/Pain Pain Assessment Pain Assessment: Faces Faces Pain Scale: Hurts even more Pain Location: operative site Pain Descriptors / Indicators: Discomfort, Operative site  guarding Pain Intervention(s): Limited activity within patient's tolerance, Monitored during session, Repositioned    Home Living                          Prior Function            PT Goals (current goals can now be found in the care plan section) Acute Rehab PT Goals Patient Stated Goal: Return home PT Goal Formulation: With patient Time For Goal Achievement: 12/31/21 Potential to Achieve Goals: Good Progress towards PT goals: Progressing toward goals    Frequency    Min 5X/week      PT Plan Current plan remains appropriate    Co-evaluation              AM-PAC PT "6 Clicks" Mobility   Outcome Measure  Help needed turning from your back to your side while in a flat bed without using bedrails?: A Little Help needed moving from lying on your back to sitting on the side of a flat bed without using bedrails?: A Little Help needed moving to and from a bed to a chair (including a wheelchair)?: A Little Help needed standing up from a chair using your arms (e.g., wheelchair or bedside chair)?: A Little Help needed to walk in hospital room?: A Little Help needed climbing 3-5 steps with a railing? : A Little 6 Click Score: 18    End of Session Equipment Utilized During Treatment: Gait belt;Back brace Activity Tolerance: Patient tolerated treatment well Patient left: in chair;with call bell/phone within reach Nurse Communication: Mobility status PT Visit Diagnosis: Unsteadiness on feet (R26.81);Pain Pain - part of body:  (back)     Time: 0938-1829 PT Time Calculation (min) (ACUTE ONLY): 23 min  Charges:  $Gait Training: 23-37 mins                     [name], PT, DPT Acute Rehabilitation Services Secure Chat Preferred Office: [phone]    [name] 12/25/2021, 11:07 AM

## 2021-12-25 NOTE — Plan of Care (Signed)
Patient alert and oriented, void, patient spike fever this am, tylenol given per order. MD notified vital sign is now stable. Surgical site clean and dry no sign of infection. D/c instructions explain and given to the patient and family. All questions answered. Patient d/c home per order.

## 2021-12-25 NOTE — Progress Notes (Signed)
Occupational Therapy Treatment Patient Details Name: Danielle Frey MRN: [id number] DOB: [birth year] Today's Date: 12/25/2021   History of present illness Pt is a 71 y.o. female s/p lumbar 3-5 PLIF/methcrylate augmentation. PMH significant for aneurysm of aortic root, HLD< spinal stenosis, TIA, R total knee replacement, and spinal surgery.   OT comments  Pt progressing towards established OT goals. Pt performing brace application with min A this session and tub transfer with min guard A. Pt with good safety awareness during tub transfer and receptive to education provided for compensatory techniques based on her personal functional abilities. Pt continues to report strong support system at home who is available to assist as needed.    Recommendations for follow up therapy are one component of a multi-disciplinary discharge planning process, led by the attending physician.  Recommendations may be updated based on patient status, additional functional criteria and insurance authorization.    Follow Up Recommendations  No OT follow up     Assistance Recommended at Discharge Intermittent Supervision/Assistance  Patient can return home with the following  A little help with walking and/or transfers;A little help with bathing/dressing/bathroom;Assistance with cooking/housework;Assist for transportation;Help with stairs or ramp for entrance   Equipment Recommendations  BSC/3in1;Other (comment) (RW)    Recommendations for Other Services      Precautions / Restrictions Precautions Precautions: Back Precaution Booklet Issued: Yes (comment) Precaution Comments: Reviewed handout and pt was cued for precautions during functional mobility Required Braces or Orthoses: Spinal Brace Spinal Brace: Lumbar corset;Applied in sitting position Restrictions Weight Bearing Restrictions: No       Mobility Bed Mobility               General bed mobility comments: received EOB     Transfers Overall transfer level: Needs assistance Equipment used: Rolling walker (2 wheels) Transfers: Sit to/from Stand Sit to Stand: Min guard           General transfer comment: VC's for hand placement on seated surface for safety. No assist required.     Balance Overall balance assessment: Mild deficits observed, not formally tested                                         ADL either performed or assessed with clinical judgement   ADL Overall ADL's : Needs assistance/impaired                 Upper Body Dressing : Minimal assistance;Sitting Upper Body Dressing Details (indicate cue type and reason): for brace application     Toilet Transfer: Min guard;Ambulation;Rolling walker (2 wheels);BSC/3in1 Toilet Transfer Details (indicate cue type and reason): to [facility].     Tub/ Shower Transfer: Tub transfer;BSC/3in1;Min guard;Ambulation;Rolling walker (2 wheels) Tub/Shower Transfer Details (indicate cue type and reason): Pt following commands. Difficulty bring RLE into simulated tub due to decreased mobility of R knee, so educating regarding compensatory techniques based on pt's personal functional abilities. Functional mobility during ADLs: Min guard;Rolling walker (2 wheels) General ADL Comments: limited by BLE weakness and decreased LE mobility during LB ADL    Extremity/Trunk Assessment Upper Extremity Assessment Upper Extremity Assessment: Generalized weakness   Lower Extremity Assessment Lower Extremity Assessment: Defer to PT evaluation        Vision   Vision Assessment?: No apparent visual deficits   Perception Perception Perception: Within Functional Limits   Praxis Praxis Praxis: Not tested  Cognition Arousal/Alertness: Awake/alert Behavior During Therapy: WFL for tasks assessed/performed Overall Cognitive Status: Within Functional Limits for tasks assessed                                 General Comments: Pt  asking meaningful questions/asking for reminders in regard to information reviewed in yesterday's session        Exercises      Shoulder Instructions       General Comments RN reporting pt with light fever this morning but okay to see.    Pertinent Vitals/ Pain       Pain Assessment Pain Assessment: Faces Faces Pain Scale: Hurts even more Pain Location: operative site Pain Descriptors / Indicators: Discomfort, Operative site guarding Pain Intervention(s): Limited activity within patient's tolerance, Monitored during session, Repositioned  Home Living                                          Prior Functioning/Environment              Frequency  Min 2X/week        Progress Toward Goals  OT Goals(current goals can now be found in the care plan section)  Progress towards OT goals: Progressing toward goals  Acute Rehab OT Goals Patient Stated Goal: go home OT Goal Formulation: With patient Time For Goal Achievement: 01/07/22 Potential to Achieve Goals: Good ADL Goals Pt Will Perform Lower Body Dressing: with modified independence;sit to/from stand Pt Will Perform Tub/Shower Transfer: Tub transfer;3 in 1;ambulating;with supervision Additional ADL Goal #1: Pt will perform STS transfers with mod I.  Plan Discharge plan remains appropriate    Co-evaluation                 AM-PAC OT "6 Clicks" Daily Activity     Outcome Measure   Help from another person eating meals?: None Help from another person taking care of personal grooming?: A Little Help from another person toileting, which includes using toliet, bedpan, or urinal?: A Little Help from another person bathing (including washing, rinsing, drying)?: A Little Help from another person to put on and taking off regular upper body clothing?: A Little Help from another person to put on and taking off regular lower body clothing?: A Little 6 Click Score: 19    End of Session Equipment  Utilized During Treatment: Gait belt;Rolling walker (2 wheels);Back brace  OT Visit Diagnosis: Unsteadiness on feet (R26.81);Muscle weakness (generalized) (M62.81);Repeated falls (R29.6);History of falling (Z91.81);Other abnormalities of gait and mobility (R26.89);Pain Pain - part of body:  (back)   Activity Tolerance Patient tolerated treatment well   Patient Left Other (comment);with call bell/phone within reach (EOB)   Nurse Communication Mobility status        Time: 4128-7867 OT Time Calculation (min): 22 min  Charges: OT General Charges $OT Visit: 1 Visit OT Treatments $Self Care/Home Management : 8-22 mins  Elder Cyphers, OTR/L [facility] Acute Rehabilitation Office: [phone]   Magnus Ivan 12/25/2021, 8:30 AM

## 2022-01-09 DIAGNOSIS — M4316 Spondylolisthesis, lumbar region: Secondary | ICD-10-CM | POA: Diagnosis not present

## 2022-02-10 ENCOUNTER — Encounter: Payer: Self-pay | Admitting: Psychology

## 2022-02-10 DIAGNOSIS — E785 Hyperlipidemia, unspecified: Secondary | ICD-10-CM | POA: Insufficient documentation

## 2022-02-10 DIAGNOSIS — I6381 Other cerebral infarction due to occlusion or stenosis of small artery: Secondary | ICD-10-CM | POA: Insufficient documentation

## 2022-02-10 DIAGNOSIS — E559 Vitamin D deficiency, unspecified: Secondary | ICD-10-CM | POA: Insufficient documentation

## 2022-02-10 DIAGNOSIS — I10 Essential (primary) hypertension: Secondary | ICD-10-CM | POA: Insufficient documentation

## 2022-02-11 ENCOUNTER — Ambulatory Visit: Payer: PPO | Admitting: Psychology

## 2022-02-11 ENCOUNTER — Ambulatory Visit: Payer: PPO

## 2022-02-11 ENCOUNTER — Encounter: Payer: Self-pay | Admitting: Psychology

## 2022-02-11 DIAGNOSIS — I6381 Other cerebral infarction due to occlusion or stenosis of small artery: Secondary | ICD-10-CM | POA: Diagnosis not present

## 2022-02-11 DIAGNOSIS — R4189 Other symptoms and signs involving cognitive functions and awareness: Secondary | ICD-10-CM

## 2022-02-11 DIAGNOSIS — G3184 Mild cognitive impairment, so stated: Secondary | ICD-10-CM | POA: Diagnosis not present

## 2022-02-11 HISTORY — DX: Mild cognitive impairment of uncertain or unknown etiology: G31.84

## 2022-02-11 NOTE — Progress Notes (Signed)
NEUROPSYCHOLOGICAL EVALUATION . [facility] Department of Neurology  Date of Evaluation: February 11, 2022  Reason for Referral:   Danielle Frey is a 72 y.o. left-handed Caucasian female referred by Sharene Butters, PA-C, to characterize her current cognitive functioning and assist with diagnostic clarity and treatment planning in the context of subjective cognitive decline.   Assessment and Plan:   Clinical Impression(s): Danielle Frey pattern of performance is suggestive of performance variability across all aspects of learning and memory. Delayed retrieval represented a greater weakness relative to encoding (i.e., learning) processes, especially across verbal tasks. Further performance variability was exhibited across executive functioning; however, this is more modest relative to memory scores. Performances across all other assessed cognitive domains were appropriate relative to age-matched peers. This includes processing speed, attention/concentration, safety/judgment, receptive and expressive language, and visuospatial abilities. Danielle Frey denied difficulties completing instrumental activities of daily living (ADLs) independently. As such, given evidence for cognitive dysfunction described above, she meets criteria for a Mild Neurocognitive Disorder ("mild cognitive impairment") at the present time.  The etiology for ongoing variability and overall weakness is unclear at the present time. Given cardiovascular medical ailments, a prior lacunar infract impacting the left thalamus, and mild cerebrovascular disease, there could certainly be a vascular cause. Variability across executive functioning and learning/memory are reasonable findings in this regard. However, it would be surprising that processing speed and attention/concentration scored well given that these areas are also commonly impacted. Given variability and some appropriate performances, she does not currently  exhibit memory performances consistent with a neurodegenerative illness such as Alzheimer's disease. She also performed very well across non-memory areas impacted by this illness early on. While this illness should not be fully ruled out, it cannot be ruled in with any degree of certainty at the present time based upon current test data.    She does not display behavioral or cognitive characteristics of Lewy body disease, another more rare parkinsonian condition, or frontotemporal lobar degeneration. Continued medical monitoring will be important moving forward.   Recommendations: A repeat neuropsychological evaluation in 18-24 months (or sooner if functional decline is noted) is recommended to assess the trajectory of future cognitive decline should it occur. This will also aid in future efforts towards improved diagnostic clarity.  Danielle Frey has already been prescribed a medication aimed to address memory loss and concerns surrounding cognitive decline (i.e., donepezil/Aricept). She is encouraged to continue taking this medication as prescribed. It is important to highlight that this medication has been shown to slow functional decline in some individuals. There is no current treatment which can stop or reverse cognitive decline when caused by a neurodegenerative illness.   Performance across neurocognitive testing is not a strong predictor of an individual's safety operating a motor vehicle. Should her family wish to pursue a formalized driving evaluation, they could reach out to the following agencies: [employer] in [place]: [phone] Driver Rehabilitative Services: [facility]: [place]: [phone] or [phone]  Should there be a progression of current deficits over time, she is unlikely to regain any independent living skills lost. Therefore, it is recommended that she remain as involved as possible in all aspects of household  chores, finances, and medication management, with supervision to ensure adequate performance. She will likely benefit from the establishment and maintenance of a routine in order to maximize functional abilities over time.  Danielle Frey is encouraged to attend to lifestyle factors for brain health (e.g., regular physical exercise,  good nutrition habits, regular participation in cognitively-stimulating activities, and general stress management techniques), which are likely to have benefits for both emotional adjustment and cognition. Optimal control of vascular risk factors (including safe cardiovascular exercise and adherence to dietary recommendations) is encouraged. Continued participation in activities which provide mental stimulation and social interaction is also recommended.   When learning new information, she would benefit from information being broken up into small, manageable pieces. She may also find it helpful to articulate the material in her own words and in a context to promote encoding at the onset of a new task. This material may need to be repeated multiple times to promote encoding.  Memory can be improved using internal strategies such as rehearsal, repetition, chunking, mnemonics, association, and imagery. External strategies such as written notes in a consistently used memory journal, visual and nonverbal auditory cues such as a calendar on the refrigerator or appointments with alarm, such as on a cell phone, can also help maximize recall.    To address problems with fluctuating attention and executive dysfunction, she may wish to consider:   -Avoiding external distractions when needing to concentrate   -Limiting exposure to fast paced environments with multiple sensory demands   -Writing down complicated information and using checklists   -Attempting and completing one task at a time (i.e., no multi-tasking)   -Verbalizing aloud each step of a task to maintain focus   -Taking  frequent breaks during the completion of steps/tasks to avoid fatigue   -Reducing the amount of information considered at one time  Review of Records:   Danielle Frey was seen by [facility] Sharene Butters, PA-C) on 05/13/2021 for an evaluation of memory loss. At that time, memory concerns were said to be present for the past year. However, they had seemed to worsen during the previous 6-7 months. Examples included repeating herself in conversation, needing to write things down more frequently, and an isolated event where she briefly became disoriented and could not "put things together" while performing yard work. ADLs were described as intact. Performance on a brief cognitive screening instrument (MOCA) was 24/30. Ultimately, Ms. Whicker was referred for a comprehensive neuropsychological evaluation to characterize her cognitive abilities and to assist with diagnostic clarity and treatment planning.   [facility] MRI on 12/24/2020 revealed mild generalized atrophy, mild microvascular ischemic disease, and a tiny lacunar infarct within the left thalamus. Brain MRI on 05/19/2021 revealed mild parenchymal volume loss and mild microvascular ischemic disease, said to be stable relative to her previous scan.   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   Spondylosis of lumbar spine 09/11/2016   TIA (transient ischemic attack)    Vitamin D deficiency     Past Surgical History:  Procedure Laterality Date   ABDOMINAL HYSTERECTOMY     APPENDECTOMY     CESAREAN SECTION     REPLACEMENT TOTAL KNEE Right    SLT LASER APPLICATION Right 50/93/2671   Procedure: SLT  LASER APPLICATION;  Surgeon: Williams Che, MD;  Location: [facility];  Service: Ophthalmology;  Laterality: Right;   SPINE SURGERY  Current Outpatient Medications:    atorvastatin (LIPITOR) 40 MG tablet, Take 40 mg by mouth at bedtime., Disp: , Rfl:    Ca Carbonate-Mag Hydroxide (ROLAIDS PO), Take 1-2 tablets by mouth daily as needed (indigestion)., Disp: , Rfl:    cetirizine (ZYRTEC) 10 MG tablet, Take 10 mg by mouth at bedtime., Disp: , Rfl:    Cholecalciferol (VITAMIN D3) 10 MCG (400 UNIT) tablet, Take 400 Units by mouth at bedtime., Disp: , Rfl:    clopidogrel (PLAVIX) 75 MG tablet, Take 75 mg by mouth at bedtime., Disp: , Rfl:    Cyanocobalamin (VITAMIN B-12 PO), Take 1 tablet by mouth daily., Disp: , Rfl:    donepezil (ARICEPT) 5 MG tablet, Take 5 mg by mouth at bedtime., Disp: , Rfl:    methocarbamol (ROBAXIN) 500 MG tablet, Take 1 tablet (500 mg total) by mouth every 6 (six) hours as needed for muscle spasms., Disp: 30 tablet, Rfl: 3   naproxen sodium (ALEVE) 220 MG tablet, Take 220 mg by mouth daily as needed (pain.)., Disp: , Rfl:    oxyCODONE-acetaminophen (PERCOCET) 5-325 MG tablet, Take 1 tablet by mouth every 4 (four) hours as needed for severe pain., Disp: 30 tablet, Rfl: 0   triamcinolone ointment (KENALOG) 0.5 %, Apply 1 Application topically 2 (two) times daily., Disp: , Rfl:   Clinical Interview:   The following information was obtained during a clinical interview with Ms. Kall prior to cognitive testing.  Cognitive Symptoms: Decreased short-term memory: Endorsed. She described experiencing short-term memory "lapses" in that she has transient trouble with immediate information retrieval. This was said to be most pronounced when under duress and that information will generally come to her later on in most cases. She also reported a tendency to repeat stories or otherwise be repetitive in conversation. Difficulties were said to have become more pronounced during the past  1-1.5 years.  Decreased long-term memory: Denied. Decreased attention/concentration: Denied. Reduced processing speed: Endorsed. Difficulties with executive functions: Denied outside of a longstanding and unchanged weakness surrounding organization and misplacing/losing things in her environment. She denied trouble with multitasking or impulsivity. She also denied any known personality changes outside of her sister stating that Ms. Gebert has become "more bossy." Difficulties with emotion regulation: Denied. Difficulties with receptive language: Denied. Difficulties with word finding: Endorsed. Decreased visuoperceptual ability: Denied.  Difficulties completing ADLs: Denied.  Additional Medical History: History of traumatic brain injury/concussion: Denied. History of stroke: Per her 2022 MRI, she has experienced a "tiny" lacunar infarct involving the left thalamus. This was said to be an asymptomatic experience.  History of seizure activity: Denied. History of known exposure to toxins: Denied. Symptoms of chronic pain: Denied outside of manageable symptoms of arthritis. More acutely, she did describe some lower back pain/discomfort stemming from an L3-4 compression surgical procedure this past November.  Experience of frequent headaches/migraines: Denied.  Sensory changes: She wears glasses with benefit. Other sensory changes/difficulties (e.g., hearing, taste, smell) were denied.  Balance/coordination difficulties: She denied balance trouble noting that she ambulates without an assistive device and has not experienced any recent falls. Since her back surgery this past November, she has been walking with a cane for added support as she continues her rehabilitation.  Other motor difficulties: Denied.  Sleep History: Estimated hours obtained each night: 6 hours.  Difficulties falling asleep: Denied. Difficulties staying asleep: Denied. She did note that upon infrequent instances where she  does wake throughout the night, she may have trouble quieting her mind and falling back  asleep quickly.  Feels rested and refreshed upon awakening: Endorsed.  History of snoring: Endorsed. History of waking up gasping for air: Denied. Witnessed breath cessation while asleep: Denied.  History of vivid dreaming: Denied. Excessive movement while asleep: Denied. Instances of acting out her dreams: Denied.  Psychiatric/Behavioral Health History: Depression: She described her current mood as "taking it as it comes." While she did not endorse feeling particularly positive or bright, she also denied current depressive symptoms. She further denied a history of any mental health concerns or diagnoses. Current or remote suicidal ideation, intent, or plan was denied.  Anxiety: Denied. Mania: Denied. Trauma History: Denied. Visual/auditory hallucinations: Denied. Delusional thoughts: Denied.  Tobacco: Denied. Alcohol: She denied current alcohol consumption as well as a history of problematic alcohol abuse or dependence.  Recreational drugs: Denied.  Family History: Problem Relation Age of Onset   Diabetes Mother    Dementia Mother        onset in 58s   Cancer Father    Breast cancer Neg Hx    This information was confirmed by Ms. Odonnell.  Academic/Vocational History: Highest level of educational attainment: 12 years. She graduated from high school and described herself as a good (A/B) student in academic settings. Trigonometry represented a potential relative weakness in academic settings.  History of developmental delay: Denied. History of grade repetition: Denied. Enrollment in special education courses: Denied. History of LD/ADHD: Denied.  Employment: Retired. 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   Evaluation Results:   Behavioral Observations: Ms. Repetto was unaccompanied, arrived to her appointment  on time, and was appropriately dressed and groomed. She appeared alert and oriented. She had trouble rising out of her chair and ambulated slowly with the assistance of a cane. This was reportedly due to her recent back procedure rather than a chronic issue. Gross motor functioning appeared intact upon informal observation and no abnormal movements (e.g., tremors) were noted. Her affect was generally relaxed and positive. Spontaneous speech was fluent and word finding difficulties were not observed during the clinical interview. Thought processes were coherent, organized, and normal in content. Insight into her cognitive difficulties appeared adequate.   During testing, she was noted to be somewhat self-defeating. She would make statements preceding all memory tasks surrounding the anticipation of poor performance and there were concerns surrounding test scores somewhat reflecting a self-fulfilling prophecy phenomenon. Sustained attention was appropriate. Task engagement was adequate and she persisted when challenged. Overall, Ms. Burges was cooperative with the clinical interview and subsequent testing procedures.   Adequacy of Effort: The validity of neuropsychological testing is limited by the extent to which the individual being tested may be assumed to have exerted adequate effort during testing. Ms. Hakeem expressed her intention to perform to the best of her abilities and exhibited adequate task engagement and persistence. Scores across stand-alone and embedded performance validity measures were within expectation. As such, the results of the current evaluation are believed to be a valid representation of Ms. Wyeth's current cognitive functioning.  Test Results: Ms. Krist was mildly disoriented at the time of the current evaluation. She incorrectly stated her age ("10"). She was also unable to state the current date or full name of the current clinic.   Intellectual abilities based upon educational  and vocational attainment were estimated to be in the average range. Premorbid abilities were estimated to be within the average range based upon a single-word reading test.  Processing speed was average. Basic attention was average. More complex attention (e.g., working memory) was well above average. Executive functioning was variable, ranging from the exceptionally low to average normative ranges. She did perform in the average range across a task assessing safety and judgment.  Assessed receptive language abilities were above average. Likewise, Ms. Vales did not exhibit any difficulties comprehending task instructions and answered all questions asked of her appropriately. Assessed expressive language (e.g., verbal fluency and confrontation naming) was average to above average.     Assessed visuospatial/visuoconstructional abilities were average to above average.    Learning (i.e., encoding) of novel verbal and visual information was variable, ranging from the well below average to average normative ranges. Spontaneous delayed recall (i.e., retrieval) of previously learned information was also variable, ranging from the well below average to average normative ranges. Retention rates were 82% across a story learning task, 25% across a list learning task, 47% across a daily living task, and 63% across a shape learning task. Performance across recognition tasks was variable, ranging from the well below average to average normative ranges, suggesting limited evidence for information consolidation.   Results of emotional screening instruments suggested that recent symptoms of generalized anxiety were in the mild range, while symptoms of depression were also within the mild range. A screening instrument assessing recent sleep quality suggested the presence of minimal sleep dysfunction.  Tables of Scores:   Note: This summary of test scores accompanies the interpretive report and should not be considered  in isolation without reference to the appropriate sections in the text. Descriptors are based on appropriate normative data and may be adjusted based on clinical judgment. Terms such as "Within Normal Limits" and "Outside Normal Limits" are used when a more specific description of the test score cannot be determined.       Percentile - Normative Descriptor > 98 - Exceptionally High 91-97 - Well Above Average 75-90 - Above Average 25-74 - Average 9-24 - Below Average 2-8 - Well Below Average < 2 - Exceptionally Low       Orientation:      Raw Score Percentile   NAB Orientation, Form 1 25/29 --- ---       Cognitive Screening:      Raw Score Percentile   SLUMS: 22/30 --- ---       Intellectual Functioning:      Standard Score Percentile   Barona Formula Estimated Premorbid IQ: 102 55 Average        Standard Score Percentile   Test of Premorbid Functioning: 95 37 Average       Memory:     NAB Memory Module, Form 1: Standard Score/ T Score Percentile   Total Memory Index 78 7 Well Below Average  List Learning       Total Trials 1-3 15/36 (37) 9 Below Average    List B 5/12 (58) 79 Above Average    Short Delay Free Recall 4/12 (38) 12 Below Average    Long Delay Free Recall 1/12 (31) 3 Well Below Average    Retention Percentage 25 (30) 2 Well Below Average    Recognition Discriminability 1 (33) 5 Well Below Average  Shape Learning       Total Trials 1-3 18/27 (61) 86 Above Average    Delayed Recall 5/9 (50) 50 Average    Retention Percentage 63 (39) 14 Below Average    Recognition Discriminability 6 (47) 38 Average  Story Learning  Immediate Recall 33/80 (30) 2 Well Below Average    Delayed Recall 18/40 (33) 5 Well Below Average    Retention Percentage 82 (45) 31 Average  Daily Living Memory       Immediate Recall 39/51 (47) 38 Average    Delayed Recall 7/17 (31) 3 Well Below Average    Retention Percentage 47 (25) 1 Exceptionally Low    Recognition Hits 6/10 (28) 2  Well Below Average       Attention/Executive Function:     Trail Making Test (TMT): Raw Score (T Score) Percentile     Part A 32 secs.,  0 errors (55) 69 Average    Part B 94 secs.,  1 error (49) 46 Average         Scaled Score Percentile   WAIS-IV Coding: 10 50 Average       NAB Attention Module, Form 1: T Score Percentile     Digits Forward 55 69 Average    Digits Backwards 67 96 Well Above Average        Scaled Score Percentile   WAIS-IV Similarities: 11 63 Average       D-KEFS Color-Word Interference Test: Raw Score (Scaled Score) Percentile     Color Naming 30 secs. (11) 63 Average    Word Reading 23 secs. (11) 63 Average    Inhibition 69 secs. (11) 63 Average      Total Errors 5 errors (8) 25 Average    Inhibition/Switching 147 secs. (1) <1 Exceptionally Low      Total Errors 11 errors (2) <1 Exceptionally Low       D-KEFS Verbal Fluency Test: Raw Score (Scaled Score) Percentile     Letter Total Correct 40 (12) 75 Above Average    Category Total Correct 36 (11) 63 Average    Category Switching Total Correct 10 (8) 25 Average    Category Switching Accuracy 6 (5) 5 Well Below Average      Total Set Loss Errors 3 (9) 37 Average      Total Repetition Errors 5 (8) 25 Average       NAB Executive Functions Module, Form 1: T Score Percentile     Judgment 54 66 Average       Language:     Verbal Fluency Test: Raw Score (T Score) Percentile     Phonemic Fluency (FAS) 40 (50) 50 Average    Animal Fluency 16 (45) 31 Average        NAB Language Module, Form 1: T Score Percentile     Auditory Comprehension 58 79 Above Average    Naming 31/31 (59) 82 Above Average       Visuospatial/Visuoconstruction:      Raw Score Percentile   Clock Drawing: 9/10 --- Within Normal Limits       NAB Spatial Module, Form 1: T Score Percentile     Figure Drawing Copy 60 84 Above Average        Scaled Score Percentile   WAIS-IV Block Design: 9 37 Average       Mood and Personality:       Raw Score Percentile   Geriatric Depression Scale: 14 --- Mild  Geriatric Anxiety Scale: 13 --- Mild    Somatic 3 --- Minimal    Cognitive 4 --- Mild    Affective 6 --- Mild       Additional Questionnaires:      Raw Score Percentile   PROMIS Sleep Disturbance Questionnaire: 11 --- None  to Slight   Informed Consent and Coding/Compliance:   The current evaluation represents a clinical evaluation for the purposes previously outlined by the referral source and is in no way reflective of a forensic evaluation.   Ms. Sleeper was provided with a verbal description of the nature and purpose of the present neuropsychological evaluation. Also reviewed were the foreseeable risks and/or discomforts and benefits of the procedure, limits of confidentiality, and mandatory reporting requirements of this provider. The patient was given the opportunity to ask questions and receive answers about the evaluation. Oral consent to participate was provided by the patient.   This evaluation was conducted by Christia Reading, Ph.D., ABPP-CN, board certified clinical neuropsychologist. Ms. Chao completed a clinical interview with Dr. Melvyn Novas, billed as one unit [phone], and 135 minutes of cognitive testing and scoring, billed as one unit [phone] and four additional units [id number]. Psychometrist Cruzita Lederer, B.S., assisted Dr. Melvyn Novas with test administration and scoring procedures. As a separate and discrete service, Dr. Melvyn Novas spent a total of 160 minutes in interpretation and report writing billed as one unit [phone] and two units 96133.

## 2022-02-11 NOTE — Progress Notes (Signed)
   Psychometrician Note   Cognitive testing was administered to Danielle Frey by Cruzita Lederer, B.S. (psychometrist) under the supervision of Dr. Christia Reading, Ph.D., licensed psychologist on 02/11/2022. Danielle Frey did not appear overtly distressed by the testing session per behavioral observation or responses across self-report questionnaires. Rest breaks were offered.    The battery of tests administered was selected by Dr. Christia Reading, Ph.D. with consideration to Danielle Frey's current level of functioning, the nature of her symptoms, emotional and behavioral responses during interview, level of literacy, observed level of motivation/effort, and the nature of the referral question. This battery was communicated to the psychometrist. Communication between Dr. Christia Reading, Ph.D. and the psychometrist was ongoing throughout the evaluation and Dr. Christia Reading, Ph.D. was immediately accessible at all times. Dr. Christia Reading, Ph.D. provided supervision to the psychometrist on the date of this service to the extent necessary to assure the quality of all services provided.    Danielle Frey will return within approximately 1-2 weeks for an interactive feedback session with Dr. Melvyn Novas at which time her test performances, clinical impressions, and treatment recommendations will be reviewed in detail. Danielle Frey understands she can contact our office should she require our assistance before this time.  A total of 135 minutes of billable time were spent face-to-face with Danielle Frey by the psychometrist. This includes both test administration and scoring time. Billing for these services is reflected in the clinical report generated by Dr. Christia Reading, Ph.D.  This note reflects time spent with the psychometrician and does not include test scores or any clinical interpretations made by Dr. Melvyn Novas. The full report will follow in a separate note.

## 2022-02-18 ENCOUNTER — Ambulatory Visit: Payer: PPO | Admitting: Psychology

## 2022-02-18 DIAGNOSIS — I6381 Other cerebral infarction due to occlusion or stenosis of small artery: Secondary | ICD-10-CM

## 2022-02-18 DIAGNOSIS — G3184 Mild cognitive impairment, so stated: Secondary | ICD-10-CM

## 2022-02-18 NOTE — Progress Notes (Signed)
   Neuropsychology Feedback Session Danielle Frey. [facility]  Reason for Referral:   Danielle Frey is a 72 y.o. left-handed Caucasian female referred by Sharene Butters, PA-C, to characterize her current cognitive functioning and assist with diagnostic clarity and treatment planning in the context of subjective cognitive decline.   Feedback:   Danielle Frey completed a comprehensive neuropsychological evaluation on 02/11/2022. Please refer to that encounter for the full report and recommendations. Briefly, results suggested performance variability across all aspects of learning and memory. Delayed retrieval represented a greater weakness relative to encoding (i.e., learning) processes, especially across verbal tasks. Further performance variability was exhibited across executive functioning; however, this is more modest relative to memory scores. Performances across all other assessed cognitive domains were appropriate relative to age-matched peers. The etiology for ongoing variability and overall weakness is unclear at the present time. Given cardiovascular medical ailments, a prior lacunar infract impacting the left thalamus, and mild cerebrovascular disease, there could certainly be a vascular cause. Variability across executive functioning and learning/memory are reasonable findings in this regard. However, it would be surprising that processing speed and attention/concentration scored well given that these areas are also commonly impacted. Given variability and some appropriate performances, she does not currently exhibit memory performances consistent with a neurodegenerative illness such as Alzheimer's disease. She also performed very well across non-memory areas impacted by this illness early on. While this illness should not be fully ruled out, it cannot be ruled in with any degree of certainty at the present time based upon current test data.   Danielle Frey was  unaccompanied during the current feedback session. Content of the current session focused on the results of her neuropsychological evaluation. Danielle Frey was given the opportunity to ask questions and her questions were answered. She was encouraged to reach out should additional questions arise. A copy of her report was provided at the conclusion of the visit.      20 minutes were spent conducting the current feedback session with Danielle Frey, billed as one unit [phone].

## 2022-02-20 DIAGNOSIS — M4316 Spondylolisthesis, lumbar region: Secondary | ICD-10-CM | POA: Diagnosis not present

## 2022-03-18 DIAGNOSIS — B9689 Other specified bacterial agents as the cause of diseases classified elsewhere: Secondary | ICD-10-CM | POA: Diagnosis not present

## 2022-03-18 DIAGNOSIS — L218 Other seborrheic dermatitis: Secondary | ICD-10-CM | POA: Diagnosis not present

## 2022-03-18 DIAGNOSIS — L0202 Furuncle of face: Secondary | ICD-10-CM | POA: Diagnosis not present

## 2022-03-18 DIAGNOSIS — L02821 Furuncle of head [any part, except face]: Secondary | ICD-10-CM | POA: Diagnosis not present

## 2022-04-01 DIAGNOSIS — Z6838 Body mass index (BMI) 38.0-38.9, adult: Secondary | ICD-10-CM | POA: Diagnosis not present

## 2022-04-01 DIAGNOSIS — R5383 Other fatigue: Secondary | ICD-10-CM | POA: Diagnosis not present

## 2022-04-01 DIAGNOSIS — E782 Mixed hyperlipidemia: Secondary | ICD-10-CM | POA: Diagnosis not present

## 2022-04-01 DIAGNOSIS — R531 Weakness: Secondary | ICD-10-CM | POA: Diagnosis not present

## 2022-04-01 DIAGNOSIS — R739 Hyperglycemia, unspecified: Secondary | ICD-10-CM | POA: Diagnosis not present

## 2022-04-01 DIAGNOSIS — R030 Elevated blood-pressure reading, without diagnosis of hypertension: Secondary | ICD-10-CM | POA: Diagnosis not present

## 2022-04-01 DIAGNOSIS — E7849 Other hyperlipidemia: Secondary | ICD-10-CM | POA: Diagnosis not present

## 2022-04-01 DIAGNOSIS — Z8673 Personal history of transient ischemic attack (TIA), and cerebral infarction without residual deficits: Secondary | ICD-10-CM | POA: Diagnosis not present

## 2022-04-06 DIAGNOSIS — M4316 Spondylolisthesis, lumbar region: Secondary | ICD-10-CM | POA: Diagnosis not present

## 2022-04-09 DIAGNOSIS — L218 Other seborrheic dermatitis: Secondary | ICD-10-CM | POA: Diagnosis not present

## 2022-04-10 DIAGNOSIS — M4316 Spondylolisthesis, lumbar region: Secondary | ICD-10-CM | POA: Diagnosis not present

## 2022-04-13 DIAGNOSIS — M4316 Spondylolisthesis, lumbar region: Secondary | ICD-10-CM | POA: Diagnosis not present

## 2022-04-13 DIAGNOSIS — H524 Presbyopia: Secondary | ICD-10-CM | POA: Diagnosis not present

## 2022-04-13 DIAGNOSIS — H40022 Open angle with borderline findings, high risk, left eye: Secondary | ICD-10-CM | POA: Diagnosis not present

## 2022-04-13 DIAGNOSIS — H26492 Other secondary cataract, left eye: Secondary | ICD-10-CM | POA: Diagnosis not present

## 2022-04-13 DIAGNOSIS — H35013 Changes in retinal vascular appearance, bilateral: Secondary | ICD-10-CM | POA: Diagnosis not present

## 2022-04-13 DIAGNOSIS — H401111 Primary open-angle glaucoma, right eye, mild stage: Secondary | ICD-10-CM | POA: Diagnosis not present

## 2022-04-24 DIAGNOSIS — M4316 Spondylolisthesis, lumbar region: Secondary | ICD-10-CM | POA: Diagnosis not present

## 2022-04-27 DIAGNOSIS — M4316 Spondylolisthesis, lumbar region: Secondary | ICD-10-CM | POA: Diagnosis not present

## 2022-04-29 DIAGNOSIS — M4316 Spondylolisthesis, lumbar region: Secondary | ICD-10-CM | POA: Diagnosis not present

## 2022-05-04 DIAGNOSIS — M4316 Spondylolisthesis, lumbar region: Secondary | ICD-10-CM | POA: Diagnosis not present

## 2022-05-11 DIAGNOSIS — M4316 Spondylolisthesis, lumbar region: Secondary | ICD-10-CM | POA: Diagnosis not present

## 2022-05-15 DIAGNOSIS — M4316 Spondylolisthesis, lumbar region: Secondary | ICD-10-CM | POA: Diagnosis not present

## 2022-05-18 DIAGNOSIS — M4316 Spondylolisthesis, lumbar region: Secondary | ICD-10-CM | POA: Diagnosis not present

## 2022-06-03 DIAGNOSIS — Z0001 Encounter for general adult medical examination with abnormal findings: Secondary | ICD-10-CM | POA: Diagnosis not present

## 2022-06-03 DIAGNOSIS — G4733 Obstructive sleep apnea (adult) (pediatric): Secondary | ICD-10-CM | POA: Diagnosis not present

## 2022-06-03 DIAGNOSIS — Z23 Encounter for immunization: Secondary | ICD-10-CM | POA: Diagnosis not present

## 2022-06-03 DIAGNOSIS — M7061 Trochanteric bursitis, right hip: Secondary | ICD-10-CM | POA: Diagnosis not present

## 2022-06-03 DIAGNOSIS — Z8673 Personal history of transient ischemic attack (TIA), and cerebral infarction without residual deficits: Secondary | ICD-10-CM | POA: Diagnosis not present

## 2022-06-03 DIAGNOSIS — R030 Elevated blood-pressure reading, without diagnosis of hypertension: Secondary | ICD-10-CM | POA: Diagnosis not present

## 2022-06-03 DIAGNOSIS — Z6841 Body Mass Index (BMI) 40.0 and over, adult: Secondary | ICD-10-CM | POA: Diagnosis not present

## 2022-06-03 DIAGNOSIS — E7849 Other hyperlipidemia: Secondary | ICD-10-CM | POA: Diagnosis not present

## 2022-06-03 DIAGNOSIS — E1169 Type 2 diabetes mellitus with other specified complication: Secondary | ICD-10-CM | POA: Diagnosis not present

## 2022-06-23 DIAGNOSIS — R519 Headache, unspecified: Secondary | ICD-10-CM | POA: Diagnosis not present

## 2022-06-23 DIAGNOSIS — Z6839 Body mass index (BMI) 39.0-39.9, adult: Secondary | ICD-10-CM | POA: Diagnosis not present

## 2022-06-23 DIAGNOSIS — R030 Elevated blood-pressure reading, without diagnosis of hypertension: Secondary | ICD-10-CM | POA: Diagnosis not present

## 2022-06-23 DIAGNOSIS — L237 Allergic contact dermatitis due to plants, except food: Secondary | ICD-10-CM | POA: Diagnosis not present

## 2022-07-08 DIAGNOSIS — M5431 Sciatica, right side: Secondary | ICD-10-CM | POA: Diagnosis not present

## 2022-07-08 DIAGNOSIS — Z6838 Body mass index (BMI) 38.0-38.9, adult: Secondary | ICD-10-CM | POA: Diagnosis not present

## 2022-07-08 DIAGNOSIS — M4316 Spondylolisthesis, lumbar region: Secondary | ICD-10-CM | POA: Diagnosis not present

## 2022-07-31 DIAGNOSIS — Z23 Encounter for immunization: Secondary | ICD-10-CM | POA: Diagnosis not present

## 2022-08-11 DIAGNOSIS — Z1231 Encounter for screening mammogram for malignant neoplasm of breast: Secondary | ICD-10-CM | POA: Diagnosis not present

## 2022-10-14 DIAGNOSIS — H401111 Primary open-angle glaucoma, right eye, mild stage: Secondary | ICD-10-CM | POA: Diagnosis not present

## 2022-10-14 DIAGNOSIS — H40022 Open angle with borderline findings, high risk, left eye: Secondary | ICD-10-CM | POA: Diagnosis not present

## 2022-12-03 DIAGNOSIS — Z6839 Body mass index (BMI) 39.0-39.9, adult: Secondary | ICD-10-CM | POA: Diagnosis not present

## 2022-12-03 DIAGNOSIS — R531 Weakness: Secondary | ICD-10-CM | POA: Diagnosis not present

## 2022-12-03 DIAGNOSIS — E1169 Type 2 diabetes mellitus with other specified complication: Secondary | ICD-10-CM | POA: Diagnosis not present

## 2022-12-03 DIAGNOSIS — R21 Rash and other nonspecific skin eruption: Secondary | ICD-10-CM | POA: Diagnosis not present

## 2022-12-03 DIAGNOSIS — E7849 Other hyperlipidemia: Secondary | ICD-10-CM | POA: Diagnosis not present

## 2022-12-03 DIAGNOSIS — E782 Mixed hyperlipidemia: Secondary | ICD-10-CM | POA: Diagnosis not present

## 2022-12-03 DIAGNOSIS — R5383 Other fatigue: Secondary | ICD-10-CM | POA: Diagnosis not present

## 2022-12-04 DIAGNOSIS — M4316 Spondylolisthesis, lumbar region: Secondary | ICD-10-CM | POA: Diagnosis not present

## 2022-12-04 DIAGNOSIS — Z6839 Body mass index (BMI) 39.0-39.9, adult: Secondary | ICD-10-CM | POA: Diagnosis not present

## 2022-12-07 ENCOUNTER — Other Ambulatory Visit (HOSPITAL_COMMUNITY): Payer: Self-pay | Admitting: Neurological Surgery

## 2022-12-07 DIAGNOSIS — M4316 Spondylolisthesis, lumbar region: Secondary | ICD-10-CM

## 2022-12-14 ENCOUNTER — Ambulatory Visit (HOSPITAL_COMMUNITY)
Admission: RE | Admit: 2022-12-14 | Discharge: 2022-12-14 | Disposition: A | Discharge status: Home / Self Care | Payer: PPO | Source: Ambulatory Visit | Attending: Neurological Surgery | Admitting: Neurological Surgery

## 2022-12-14 DIAGNOSIS — M5135 Other intervertebral disc degeneration, thoracolumbar region: Secondary | ICD-10-CM | POA: Diagnosis not present

## 2022-12-14 DIAGNOSIS — M47816 Spondylosis without myelopathy or radiculopathy, lumbar region: Secondary | ICD-10-CM | POA: Diagnosis not present

## 2022-12-14 DIAGNOSIS — M5136 Other intervertebral disc degeneration, lumbar region with discogenic back pain only: Secondary | ICD-10-CM | POA: Diagnosis not present

## 2022-12-14 DIAGNOSIS — M48061 Spinal stenosis, lumbar region without neurogenic claudication: Secondary | ICD-10-CM | POA: Diagnosis not present

## 2022-12-14 DIAGNOSIS — M4316 Spondylolisthesis, lumbar region: Secondary | ICD-10-CM | POA: Diagnosis not present

## 2022-12-14 MED ORDER — GADOBUTROL 1 MMOL/ML IV SOLN
10.0000 mL | Freq: Once | INTRAVENOUS | Status: AC | PRN
  Administered 2022-12-14: 10 mL via INTRAVENOUS

## 2022-12-29 DIAGNOSIS — Z6839 Body mass index (BMI) 39.0-39.9, adult: Secondary | ICD-10-CM | POA: Diagnosis not present

## 2022-12-29 DIAGNOSIS — M4316 Spondylolisthesis, lumbar region: Secondary | ICD-10-CM | POA: Diagnosis not present

## 2023-01-04 DIAGNOSIS — Z9181 History of falling: Secondary | ICD-10-CM | POA: Diagnosis not present

## 2023-01-04 DIAGNOSIS — M6281 Muscle weakness (generalized): Secondary | ICD-10-CM | POA: Diagnosis not present

## 2023-01-04 DIAGNOSIS — M51379 Other intervertebral disc degeneration, lumbosacral region without mention of lumbar back pain or lower extremity pain: Secondary | ICD-10-CM | POA: Diagnosis not present

## 2023-01-14 DIAGNOSIS — Z9181 History of falling: Secondary | ICD-10-CM | POA: Diagnosis not present

## 2023-01-14 DIAGNOSIS — M51379 Other intervertebral disc degeneration, lumbosacral region without mention of lumbar back pain or lower extremity pain: Secondary | ICD-10-CM | POA: Diagnosis not present

## 2023-01-14 DIAGNOSIS — M6281 Muscle weakness (generalized): Secondary | ICD-10-CM | POA: Diagnosis not present

## 2023-01-25 DIAGNOSIS — L409 Psoriasis, unspecified: Secondary | ICD-10-CM | POA: Diagnosis not present

## 2023-01-25 DIAGNOSIS — R030 Elevated blood-pressure reading, without diagnosis of hypertension: Secondary | ICD-10-CM | POA: Diagnosis not present

## 2023-01-25 DIAGNOSIS — Z6841 Body Mass Index (BMI) 40.0 and over, adult: Secondary | ICD-10-CM | POA: Diagnosis not present

## 2023-03-08 DIAGNOSIS — L218 Other seborrheic dermatitis: Secondary | ICD-10-CM | POA: Diagnosis not present

## 2023-04-09 DIAGNOSIS — E782 Mixed hyperlipidemia: Secondary | ICD-10-CM | POA: Diagnosis not present

## 2023-04-09 DIAGNOSIS — L408 Other psoriasis: Secondary | ICD-10-CM | POA: Diagnosis not present

## 2023-04-09 DIAGNOSIS — R5383 Other fatigue: Secondary | ICD-10-CM | POA: Diagnosis not present

## 2023-04-09 DIAGNOSIS — E1169 Type 2 diabetes mellitus with other specified complication: Secondary | ICD-10-CM | POA: Diagnosis not present

## 2023-04-09 DIAGNOSIS — Z6841 Body Mass Index (BMI) 40.0 and over, adult: Secondary | ICD-10-CM | POA: Diagnosis not present

## 2023-04-09 DIAGNOSIS — R531 Weakness: Secondary | ICD-10-CM | POA: Diagnosis not present

## 2023-04-10 LAB — LAB REPORT - SCANNED
A1c: 5.4
EGFR: 94
TSH: 2.12

## 2023-04-15 DIAGNOSIS — R413 Other amnesia: Secondary | ICD-10-CM | POA: Diagnosis not present

## 2023-04-15 DIAGNOSIS — I6789 Other cerebrovascular disease: Secondary | ICD-10-CM | POA: Diagnosis not present

## 2023-04-15 DIAGNOSIS — R2689 Other abnormalities of gait and mobility: Secondary | ICD-10-CM | POA: Diagnosis not present

## 2023-04-15 DIAGNOSIS — G458 Other transient cerebral ischemic attacks and related syndromes: Secondary | ICD-10-CM | POA: Diagnosis not present

## 2023-04-15 DIAGNOSIS — R531 Weakness: Secondary | ICD-10-CM | POA: Diagnosis not present

## 2023-04-15 DIAGNOSIS — R296 Repeated falls: Secondary | ICD-10-CM | POA: Diagnosis not present

## 2023-04-30 DIAGNOSIS — M6281 Muscle weakness (generalized): Secondary | ICD-10-CM | POA: Diagnosis not present

## 2023-04-30 DIAGNOSIS — Z8673 Personal history of transient ischemic attack (TIA), and cerebral infarction without residual deficits: Secondary | ICD-10-CM | POA: Diagnosis not present

## 2023-04-30 DIAGNOSIS — R296 Repeated falls: Secondary | ICD-10-CM | POA: Diagnosis not present

## 2023-05-04 DIAGNOSIS — M6281 Muscle weakness (generalized): Secondary | ICD-10-CM | POA: Diagnosis not present

## 2023-05-04 DIAGNOSIS — R296 Repeated falls: Secondary | ICD-10-CM | POA: Diagnosis not present

## 2023-05-04 DIAGNOSIS — Z8673 Personal history of transient ischemic attack (TIA), and cerebral infarction without residual deficits: Secondary | ICD-10-CM | POA: Diagnosis not present

## 2023-05-11 DIAGNOSIS — Z8673 Personal history of transient ischemic attack (TIA), and cerebral infarction without residual deficits: Secondary | ICD-10-CM | POA: Diagnosis not present

## 2023-05-11 DIAGNOSIS — R296 Repeated falls: Secondary | ICD-10-CM | POA: Diagnosis not present

## 2023-05-11 DIAGNOSIS — M6281 Muscle weakness (generalized): Secondary | ICD-10-CM | POA: Diagnosis not present

## 2023-05-13 DIAGNOSIS — R296 Repeated falls: Secondary | ICD-10-CM | POA: Diagnosis not present

## 2023-05-13 DIAGNOSIS — M6281 Muscle weakness (generalized): Secondary | ICD-10-CM | POA: Diagnosis not present

## 2023-05-13 DIAGNOSIS — Z8673 Personal history of transient ischemic attack (TIA), and cerebral infarction without residual deficits: Secondary | ICD-10-CM | POA: Diagnosis not present

## 2023-05-18 DIAGNOSIS — Z8673 Personal history of transient ischemic attack (TIA), and cerebral infarction without residual deficits: Secondary | ICD-10-CM | POA: Diagnosis not present

## 2023-05-18 DIAGNOSIS — R296 Repeated falls: Secondary | ICD-10-CM | POA: Diagnosis not present

## 2023-05-18 DIAGNOSIS — M6281 Muscle weakness (generalized): Secondary | ICD-10-CM | POA: Diagnosis not present

## 2023-05-21 DIAGNOSIS — M6281 Muscle weakness (generalized): Secondary | ICD-10-CM | POA: Diagnosis not present

## 2023-05-21 DIAGNOSIS — R296 Repeated falls: Secondary | ICD-10-CM | POA: Diagnosis not present

## 2023-05-21 DIAGNOSIS — Z8673 Personal history of transient ischemic attack (TIA), and cerebral infarction without residual deficits: Secondary | ICD-10-CM | POA: Diagnosis not present

## 2023-05-25 DIAGNOSIS — Z8673 Personal history of transient ischemic attack (TIA), and cerebral infarction without residual deficits: Secondary | ICD-10-CM | POA: Diagnosis not present

## 2023-05-25 DIAGNOSIS — R296 Repeated falls: Secondary | ICD-10-CM | POA: Diagnosis not present

## 2023-05-25 DIAGNOSIS — M6281 Muscle weakness (generalized): Secondary | ICD-10-CM | POA: Diagnosis not present

## 2023-05-28 DIAGNOSIS — H608X1 Other otitis externa, right ear: Secondary | ICD-10-CM | POA: Diagnosis not present

## 2023-05-28 DIAGNOSIS — Z6839 Body mass index (BMI) 39.0-39.9, adult: Secondary | ICD-10-CM | POA: Diagnosis not present

## 2023-06-10 DIAGNOSIS — H40022 Open angle with borderline findings, high risk, left eye: Secondary | ICD-10-CM | POA: Diagnosis not present

## 2023-06-10 DIAGNOSIS — H401111 Primary open-angle glaucoma, right eye, mild stage: Secondary | ICD-10-CM | POA: Diagnosis not present

## 2023-06-10 DIAGNOSIS — H43812 Vitreous degeneration, left eye: Secondary | ICD-10-CM | POA: Diagnosis not present

## 2023-06-10 DIAGNOSIS — H35363 Drusen (degenerative) of macula, bilateral: Secondary | ICD-10-CM | POA: Diagnosis not present

## 2023-06-10 DIAGNOSIS — H35013 Changes in retinal vascular appearance, bilateral: Secondary | ICD-10-CM | POA: Diagnosis not present

## 2023-06-16 DIAGNOSIS — H608X1 Other otitis externa, right ear: Secondary | ICD-10-CM | POA: Diagnosis not present

## 2023-06-16 DIAGNOSIS — L409 Psoriasis, unspecified: Secondary | ICD-10-CM | POA: Diagnosis not present

## 2023-06-16 DIAGNOSIS — Z6839 Body mass index (BMI) 39.0-39.9, adult: Secondary | ICD-10-CM | POA: Diagnosis not present

## 2023-06-16 DIAGNOSIS — H608X2 Other otitis externa, left ear: Secondary | ICD-10-CM | POA: Diagnosis not present

## 2023-06-16 DIAGNOSIS — M2550 Pain in unspecified joint: Secondary | ICD-10-CM | POA: Diagnosis not present

## 2023-08-26 DIAGNOSIS — L400 Psoriasis vulgaris: Secondary | ICD-10-CM | POA: Diagnosis not present

## 2023-08-27 DIAGNOSIS — Z6838 Body mass index (BMI) 38.0-38.9, adult: Secondary | ICD-10-CM | POA: Diagnosis not present

## 2023-08-27 DIAGNOSIS — R413 Other amnesia: Secondary | ICD-10-CM | POA: Diagnosis not present

## 2023-08-27 DIAGNOSIS — F411 Generalized anxiety disorder: Secondary | ICD-10-CM | POA: Diagnosis not present

## 2023-08-31 ENCOUNTER — Ambulatory Visit (INDEPENDENT_AMBULATORY_CARE_PROVIDER_SITE_OTHER): Admitting: Otolaryngology

## 2023-08-31 ENCOUNTER — Encounter (INDEPENDENT_AMBULATORY_CARE_PROVIDER_SITE_OTHER): Payer: Self-pay | Admitting: Otolaryngology

## 2023-08-31 VITALS — BP 130/79 | HR 72 | Ht 66.0 in | Wt 234.0 lb

## 2023-08-31 DIAGNOSIS — T162XXA Foreign body in left ear, initial encounter: Secondary | ICD-10-CM | POA: Diagnosis not present

## 2023-08-31 DIAGNOSIS — H9012 Conductive hearing loss, unilateral, left ear, with unrestricted hearing on the contralateral side: Secondary | ICD-10-CM | POA: Diagnosis not present

## 2023-09-01 DIAGNOSIS — T162XXA Foreign body in left ear, initial encounter: Secondary | ICD-10-CM | POA: Insufficient documentation

## 2023-09-01 DIAGNOSIS — H9012 Conductive hearing loss, unilateral, left ear, with unrestricted hearing on the contralateral side: Secondary | ICD-10-CM | POA: Insufficient documentation

## 2023-09-01 NOTE — Progress Notes (Signed)
 CC: Left ear hearing loss, clogging sensation in the left ear  HPI:  Danielle Frey is a 73 y.o. female who presents today complaining of left ear muffled hearing and clogging sensation in her left ear for the past 2 months.  She was recently diagnosed with left otitis externa.  She also has a history of psoriasis.  She has no previous history of otitis media or otitis externa.  She denies any significant otalgia, otorrhea, or vertigo.  She has no previous ENT surgery.  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   TIA (transient ischemic attack)    Vitamin D deficiency     Past Surgical History:  Procedure Laterality Date   ABDOMINAL HYSTERECTOMY     APPENDECTOMY     CESAREAN SECTION     REPLACEMENT TOTAL KNEE Right    SLT LASER APPLICATION Right 11/12/2014   Procedure: SLT LASER APPLICATION;  Surgeon: Dow JULIANNA Burke, MD;  Location: [facility];  Service: Ophthalmology;  Laterality: Right;   SPINE SURGERY      Family History  Problem Relation Age of Onset   Diabetes Mother    Dementia Mother        onset in 77s   Cancer Father    Breast cancer Neg Hx     Social History:  reports that she quit smoking about 26 years ago. Her smoking use included cigarettes. She started smoking about 51 years ago. She has a 25 pack-year smoking history. She has never used smokeless tobacco. She reports that she does not drink alcohol  and does not use drugs.  Allergies: No Known Allergies  Prior to Admission medications   Medication Sig  Start Date End Date Taking? Authorizing Provider  atorvastatin  (LIPITOR) 40 MG tablet Take 40 mg by mouth at bedtime. 02/15/21  Yes [provider]  Ca Carbonate-Mag Hydroxide (ROLAIDS PO) Take 1-2 tablets by mouth daily as needed (indigestion).   Yes [provider]  cetirizine (ZYRTEC) 10 MG tablet Take 10 mg by mouth at bedtime. 12/26/20  Yes [provider]  Cholecalciferol (VITAMIN D3) 10 MCG (400 UNIT) tablet Take 400 Units by mouth at bedtime.   Yes [provider]  clopidogrel (PLAVIX) 75 MG tablet Take 75 mg by mouth at bedtime. 01/31/21  Yes [provider]  Cyanocobalamin (VITAMIN B-12 PO) Take 1 tablet by mouth daily.   Yes [provider]  donepezil  (ARICEPT ) 5 MG tablet Take 5 mg by mouth at bedtime. 02/15/21  Yes [provider]  methocarbamol  (ROBAXIN ) 500 MG tablet Take 1 tablet (500 mg total) by mouth every 6 (six) hours as needed for muscle spasms. 12/25/21  Yes Colon Shove, MD  naproxen sodium (ALEVE) 220 MG tablet Take 220 mg by mouth daily as needed (pain.).   Yes [provider]  triamcinolone  ointment (KENALOG ) 0.5 % Apply 1 Application topically 2 (two) times daily.   Yes [provider]    Blood pressure 130/79, pulse 72, height 5' 6 (1.676 m),  weight 234 lb (106.1 kg), SpO2 94%. Exam: General: Communicates without difficulty, well nourished, no acute distress. Head: Normocephalic, no evidence injury, no tenderness, facial buttresses intact without stepoff. Face/sinus: No tenderness to palpation and percussion. Facial movement is normal and symmetric. Eyes: PERRL, EOMI. No scleral icterus, conjunctivae clear. Neuro: CN II exam reveals vision grossly intact.  No nystagmus at any point of gaze. Ears: Auricles well formed without lesions.  A cottonoid foreign body is noted to be impacted within the left ear canal.  The right ear canal, tympanic membrane, and middle ear space are normal.  Nose:  External evaluation reveals normal support and skin without lesions.  Dorsum is intact.  Anterior rhinoscopy reveals normal mucosa over anterior aspect of inferior turbinates and intact septum.  No purulence noted. Oral:  Oral cavity and oropharynx are intact, symmetric, without erythema or edema.  Mucosa is moist without lesions. Neck: Full range of motion without pain.  There is no significant lymphadenopathy.  No masses palpable.  Thyroid  bed within normal limits to palpation.  Parotid glands and submandibular glands equal bilaterally without mass.  Trachea is midline. Neuro:  CN 2-12 grossly intact.   Procedure: Left ear foreign body removal.   Anesthesia: None Description of the procedure: The patient is placed on a supine position.  Under the operating microscope, the left ear canal is examined.  A cottonoid foreign body is noted to be impacted on the medial aspect of the ear canal.  Under the microscope, the foreign body is carefully removed with an alligator forceps and a 90 hook.  After foreign body removal, the tympanic membrane and middle ear space are noted to be normal.  The patient tolerated the procedure well.  Assessment: 1.  Left ear conductive hearing loss, secondary to the impacted cottonoid foreign body within the left ear canal. 2.  After the foreign body removal, both tympanic membranes and middle ear spaces are noted to be normal.  Plan: 1.  The physical exam findings are reviewed with the patient. 2.  Otomicroscopy with removal of the left ear foreign body. 3.  The patient is reassured that no infection is noted today. 4.  The patient is encouraged to call with any questions or concerns.  Sarissa Dern W Aaleyah Witherow 09/01/2023, 8:01 AM

## 2023-09-17 DIAGNOSIS — R42 Dizziness and giddiness: Secondary | ICD-10-CM | POA: Diagnosis not present

## 2023-09-17 DIAGNOSIS — Z8673 Personal history of transient ischemic attack (TIA), and cerebral infarction without residual deficits: Secondary | ICD-10-CM | POA: Diagnosis not present

## 2023-09-17 DIAGNOSIS — Z6839 Body mass index (BMI) 39.0-39.9, adult: Secondary | ICD-10-CM | POA: Diagnosis not present

## 2023-09-17 DIAGNOSIS — Z87891 Personal history of nicotine dependence: Secondary | ICD-10-CM | POA: Diagnosis not present

## 2023-09-17 DIAGNOSIS — R4182 Altered mental status, unspecified: Secondary | ICD-10-CM | POA: Diagnosis not present

## 2023-09-17 DIAGNOSIS — H539 Unspecified visual disturbance: Secondary | ICD-10-CM | POA: Diagnosis not present

## 2023-09-17 DIAGNOSIS — I10 Essential (primary) hypertension: Secondary | ICD-10-CM | POA: Diagnosis not present

## 2023-09-29 DIAGNOSIS — R42 Dizziness and giddiness: Secondary | ICD-10-CM | POA: Diagnosis not present

## 2023-09-29 DIAGNOSIS — Z6837 Body mass index (BMI) 37.0-37.9, adult: Secondary | ICD-10-CM | POA: Diagnosis not present

## 2023-09-29 DIAGNOSIS — I10 Essential (primary) hypertension: Secondary | ICD-10-CM | POA: Diagnosis not present

## 2023-10-05 DIAGNOSIS — L400 Psoriasis vulgaris: Secondary | ICD-10-CM | POA: Diagnosis not present

## 2023-10-07 ENCOUNTER — Encounter: Payer: Self-pay | Admitting: Neuroradiology

## 2023-10-07 ENCOUNTER — Ambulatory Visit (INDEPENDENT_AMBULATORY_CARE_PROVIDER_SITE_OTHER): Admitting: Neuroradiology

## 2023-10-07 VITALS — BP 130/77 | HR 79 | Ht 66.0 in | Wt 237.0 lb

## 2023-10-07 DIAGNOSIS — Z981 Arthrodesis status: Secondary | ICD-10-CM | POA: Diagnosis not present

## 2023-10-07 DIAGNOSIS — M48062 Spinal stenosis, lumbar region with neurogenic claudication: Secondary | ICD-10-CM

## 2023-10-07 DIAGNOSIS — M545 Low back pain, unspecified: Secondary | ICD-10-CM | POA: Diagnosis not present

## 2023-10-07 NOTE — Progress Notes (Signed)
 I had the pleasure of seeing [name] in the office today for evaluation of back pain.  She had a L3-L5 lumbar fusion 12/23/2021 by Dr. Colon for severe spinal stenosis.  At that point she was barely ambulatory.  She has improved, and is able to ambulate with a cane.  She is complaining of severe pain that is only relieved when she hunches forward.  Because her balance is poor, she has had a couple falls.  The onset of the pain has been gradual.  She had a follow-up MRI 12/14/2022.  I reviewed that study in detail.  The fusion appears solid.  She does have some degenerative changes on that study at L3-4, but no significant spinal stenosis.    Assessment:  Severe back pain.  Previous lumbar fusion at L3-L5.  The relief of symptoms with hunching forward suggests spinal stenosis, which may represent adjacent level disease.  Plan:  She needs a MRI to assess for adjacent level disease.  I will go ahead and order this and make a referral to Dr. Colon for evaluation if there is in fact a surgical problem.

## 2023-10-20 DIAGNOSIS — Z6837 Body mass index (BMI) 37.0-37.9, adult: Secondary | ICD-10-CM | POA: Diagnosis not present

## 2023-10-20 DIAGNOSIS — M4316 Spondylolisthesis, lumbar region: Secondary | ICD-10-CM | POA: Diagnosis not present

## 2023-11-02 DIAGNOSIS — M5417 Radiculopathy, lumbosacral region: Secondary | ICD-10-CM | POA: Diagnosis not present

## 2023-11-02 DIAGNOSIS — M5117 Intervertebral disc disorders with radiculopathy, lumbosacral region: Secondary | ICD-10-CM | POA: Diagnosis not present

## 2023-11-23 DIAGNOSIS — L400 Psoriasis vulgaris: Secondary | ICD-10-CM | POA: Diagnosis not present

## 2023-11-23 DIAGNOSIS — L821 Other seborrheic keratosis: Secondary | ICD-10-CM | POA: Diagnosis not present

## 2023-11-30 NOTE — Progress Notes (Signed)
 Office Visit Note  Patient: Danielle Frey             Date of Birth: 05-Jun-1950           MRN: [id number]             PCP: Dow Longs, PA-C Referring: Dow Longs, PA-C Visit Date: 12/14/2023 Occupation: Data Unavailable  Subjective:  Pain in joints, psoriasis  History of Present Illness: Danielle Frey is a 73 y.o. female seen for the evaluation of arthralgias and psoriasis.  According the patient her joint pain started in her 30s mostly in her lower back, her right knee joint and her hands.  She states her symptoms gradually got worse.  In 2006 she underwent right total knee replacement which improved her discomfort.  She has limited range of motion in her right knee joint.  For the lower back pain she had lumbar spine fusion in 2023 by Dr. Colon.  She has noticed some improvement in the lower back pain.  She continues to have discomfort in her lower back, some discomfort in her hips.  She says she has poor balance and instability with her gait.  She ambulates with the help of a cane.  She gives history of morning stiffness.  None of the other joints are painful.  There is no history of joint swelling.  She states she has a rash on her scalp and her face for the last 5 years and was diagnosed with psoriasis last year.  She has been using topical agents which has been helpful.  There is questionable history of osteoarthritis in her mother.  She is left handed.  She is retired and worked as a aeronautical engineer for many years.  She enjoys crochet and baking.  She also does all her yard work.  She is divorced, gravida 1, para 1.  There is no history of preeclampsia.  She had DVT in the past.  She does not drink any alcohol .  She smoked 1 pack/day for 20 years and quit smoking in 2002.    Activities of Daily Living:  Patient reports morning stiffness for all day. Patient Reports nocturnal pain.  Difficulty dressing/grooming: Reports Difficulty climbing stairs: Reports Difficulty getting out of  chair: Reports Difficulty using hands for taps, buttons, cutlery, and/or writing: Denies  Review of Systems  Constitutional:  Positive for fatigue.  HENT:  Negative for mouth sores and mouth dryness.   Eyes:  Positive for dryness.  Respiratory:  Negative for shortness of breath.   Cardiovascular:  Negative for chest pain and palpitations.  Gastrointestinal:  Negative for blood in stool, constipation and diarrhea.  Endocrine: Negative for increased urination.  Genitourinary:  Negative for involuntary urination.  Musculoskeletal:  Positive for joint pain, gait problem, joint pain, myalgias, morning stiffness and myalgias. Negative for joint swelling, muscle weakness and muscle tenderness.  Skin:  Negative for color change, rash, hair loss and sensitivity to sunlight.  Allergic/Immunologic: Negative for susceptible to infections.  Neurological:  Positive for dizziness. Negative for headaches.  Hematological:  Negative for swollen glands.  Psychiatric/Behavioral:  Positive for depressed mood. Negative for sleep disturbance. The patient is nervous/anxious.     PMFS History:  Patient Active Problem List   Diagnosis Date Noted   Foreign body of left ear 09/01/2023   Conductive hearing loss in left ear 09/01/2023   Mild cognitive impairment of uncertain or unknown etiology 02/11/2022   Hyperlipidemia 02/10/2022   Hypertension    Vitamin D deficiency  Lacunar infarction    Morbid obesity due to excess calories 10/19/2016   Spondylosis of lumbar spine 09/11/2016   Chronic pain disorder 09/11/2016   Lumbar degenerative disc disease 09/11/2016   Lumbar facet joint pain 09/11/2016   Spinal stenosis of lumbar region with neurogenic claudication 09/11/2016    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    Lumbar degenerative disc disease 09/11/2016    Lumbar facet joint pain 09/11/2016   Mild cognitive impairment of uncertain or unknown etiology 02/11/2022   Morbid obesity due to excess calories 10/19/2016   Spinal stenosis of lumbar region with neurogenic claudication 09/11/2016   Spondylosis of lumbar spine 09/11/2016   TIA (transient ischemic attack)    Vitamin D deficiency     Family History  Problem Relation Age of Onset   Hypertension Mother    Diabetes Mother    Dementia Mother        onset in 73s   Cancer Father    Arthritis Sister    Diabetes Sister    Hypertension Sister    High Cholesterol Sister    Arthritis Brother    Cancer Brother    Stroke Brother    Cancer Brother    Healthy Son    Breast cancer Neg Hx    Past Surgical History:  Procedure Laterality Date   ABDOMINAL HYSTERECTOMY     APPENDECTOMY     CESAREAN SECTION     REPLACEMENT TOTAL KNEE Right    SLT LASER APPLICATION Right 11/12/2014   Procedure: SLT LASER APPLICATION;  Surgeon: Dow JULIANNA Burke, MD;  Location: [facility];  Service: Ophthalmology;  Laterality: Right;   SPINE SURGERY     Social History   Tobacco Use   Smoking status: Former    Current packs/day: 0.00    Average packs/day: 1 pack/day for 25.0 years (25.0 ttl pk-yrs)    Types: Cigarettes    Start date: 10    Quit date: 1999    Years since quitting: 26.8    Passive exposure: Current (minimal)   Smokeless tobacco: Never  Vaping Use   Vaping status: Never Used  Substance Use Topics   Alcohol  use: Never   Drug use: Never   Social History   Social History Narrative   Left handed   Drinks cafffeine occ   Single mobile home      There is no immunization history on file for this patient.   Objective: Vital Signs: BP 135/80 (BP Location: Right Arm, Patient Position: Sitting, Cuff Size: Large)   Pulse 73   Temp (!) 97.3 F (36.3 C)   Resp 15   Ht 5' 5 (1.651 m)   Wt 231 lb (104.8 kg)   BMI 38.44 kg/m    Physical Exam Vitals and nursing note reviewed.   Constitutional:      Appearance: She is well-developed.  HENT:     Head: Normocephalic and atraumatic.  Eyes:     Conjunctiva/sclera: Conjunctivae normal.  Cardiovascular:     Rate and Rhythm: Normal rate and regular rhythm.     Heart sounds: Normal heart sounds.  Pulmonary:     Effort: Pulmonary effort is normal.     Breath sounds: Normal breath sounds.  Abdominal:     General: Bowel sounds are normal.  Palpations: Abdomen is soft.  Musculoskeletal:     Cervical back: Normal range of motion.  Lymphadenopathy:     Cervical: No cervical adenopathy.  Skin:    General: Skin is warm and dry.     Capillary Refill: Capillary refill takes less than 2 seconds.  Neurological:     Mental Status: She is alert and oriented to person, place, and time.  Psychiatric:        Behavior: Behavior normal.      Musculoskeletal Exam: Cervical spine was in good range of motion.  Thoracic kyphosis was noted.  She had limited painful range of motion of the lumbar spine.  She had no SI joint tenderness.  Shoulders, elbows, wrist joints, MCPs PIPs and DIPs were in good range of motion.  She had bilateral CMC PIP and DIP thickening with no synovitis.  Hip joints and knee joints were in good range of motion.  She has some limitation of flexion of her right knee joint which was replaced.  No warmth swelling or effusion was noted.  There was no tenderness over ankles or MTPs.  No synovitis was noted.  CDAI Exam: CDAI Score: -- Patient Global: --; Provider Global: -- Swollen: --; Tender: -- Joint Exam 12/14/2023   No joint exam has been documented for this visit   There is currently no information documented on the homunculus. Go to the Rheumatology activity and complete the homunculus joint exam.  Investigation: No additional findings.  Imaging: No results found.  Recent Labs: Lab Results  Component Value Date   WBC 13.9 (H) 12/24/2021   HGB 9.1 (L) 12/24/2021   PLT 166 12/24/2021   NA 133  (L) 12/24/2021   K 3.6 12/24/2021   CL 104 12/24/2021   CO2 22 12/24/2021   GLUCOSE 180 (H) 12/24/2021   BUN 14 12/24/2021   CREATININE 0.99 12/24/2021   CALCIUM  8.3 (L) 12/24/2021   April 09, 2023 CBC WBC 6.1, hemoglobin 13.7, platelets 257, CMP GFR 94, AST 17, ALT 12, hemoglobin A1c 5.4, lipid panel LDL 106, TSH 2.120 Speciality Comments: No specialty comments available.  Procedures:  No procedures performed Allergies: Patient has no known allergies.   Assessment / Plan:     Visit Diagnoses: Polyarthralgia-patient complains of pain and discomfort in her lower back, right knee and her hands for many years.  The right knee joint pain improved after the right total knee replacement in 2006.  She has noticed some improvement in the lower back pain after the lumbar spine surgery in 2023.  However she continues to have some ongoing lower back pain which radiates into her hips as she walks.  She states she had physical therapy in 2023.  She complains of poor balance and instability.  She ambulates with the help of a cane.  She denies history of joint swelling.  There is no history of plantar fasciitis or Achilles tendinitis.  Psoriasis -patient had psoriasis on her scalp and face for the last 5 years.  She was diagnosed last year with psoriasis.  She uses clobetasol 0.05% topical agent as needed.  No active psoriasis lesions were noted.  Primary osteoarthritis of both hands-she does not have much discomfort in her hands.  Bilateral PIP and DIP thickening was noted.  No synovitis was noted on the examination.  She had no evidence of psoriatic arthritis.  Joint protection muscle strengthening was discussed.  A handout on hand exercises was given.  Status post total knee replacement, right - 2006.  Patient states she used to have nerve discomfort in her right knee joint.  She did much better after the right knee joint replacement.  She had some limitation of flexion without any warmth swelling or  effusion.  Spondylosis of lumbar spine - Status post fusion November 2023 by Dr. Colon.  She continues to have lower back pain and radiculopathy.  She states she has seen Dr. Colon who did not recommend any further surgery.  She has not had physical therapy since 2023.  I will refer her to physical therapy again.  Core strength exercises were demonstrated and discussed.  Poor balance -she complains of poor balance.  She ambulates with the help of a cane.  She may benefit with the lower extremity muscle strengthening exercises.  I will refer her to physical therapy.  Chronic pain disorder -she continues to live in chronic discomfort.  On tramadol 50 mg 3 times daily.  Primary hypertension-blood pressure was normal at 135/80.  Other medical problems are listed as follows:  Mixed hyperlipidemia  Lacunar infarction  Vitamin D deficiency  Conductive hearing loss of left ear, unspecified hearing status on contralateral side  Status post bariatric surgery - 1994  Morbid obesity due to excess calories-she is trying intentional weight loss.  Benefits of weight loss diet and exercise was discussed.  She may benefit from water walking and water aerobics.  Former smoker - She quit smoking in 2002.  She smoked 1 pack/day for 20 years.  Orders: Orders Placed This Encounter  Procedures   Ambulatory referral to Physical Therapy   No orders of the defined types were placed in this encounter.    Follow-Up Instructions: Return if symptoms worsen or fail to improve, for Osteoarthritis.   Maya Nash, MD  Note - This record has been created using Animal nutritionist.  Chart creation errors have been sought, but may not always  have been located. Such creation errors do not reflect on  the standard of medical care.

## 2023-12-14 ENCOUNTER — Encounter: Payer: Self-pay | Admitting: Rheumatology

## 2023-12-14 ENCOUNTER — Ambulatory Visit: Attending: Rheumatology | Admitting: Rheumatology

## 2023-12-14 VITALS — BP 135/80 | HR 73 | Temp 97.3°F | Resp 15 | Ht 65.0 in | Wt 231.0 lb

## 2023-12-14 DIAGNOSIS — H9012 Conductive hearing loss, unilateral, left ear, with unrestricted hearing on the contralateral side: Secondary | ICD-10-CM | POA: Diagnosis not present

## 2023-12-14 DIAGNOSIS — Z9884 Bariatric surgery status: Secondary | ICD-10-CM

## 2023-12-14 DIAGNOSIS — Z96651 Presence of right artificial knee joint: Secondary | ICD-10-CM | POA: Diagnosis not present

## 2023-12-14 DIAGNOSIS — Z87891 Personal history of nicotine dependence: Secondary | ICD-10-CM

## 2023-12-14 DIAGNOSIS — L409 Psoriasis, unspecified: Secondary | ICD-10-CM | POA: Diagnosis not present

## 2023-12-14 DIAGNOSIS — R2689 Other abnormalities of gait and mobility: Secondary | ICD-10-CM

## 2023-12-14 DIAGNOSIS — M19041 Primary osteoarthritis, right hand: Secondary | ICD-10-CM

## 2023-12-14 DIAGNOSIS — G894 Chronic pain syndrome: Secondary | ICD-10-CM

## 2023-12-14 DIAGNOSIS — I10 Essential (primary) hypertension: Secondary | ICD-10-CM | POA: Diagnosis not present

## 2023-12-14 DIAGNOSIS — I6381 Other cerebral infarction due to occlusion or stenosis of small artery: Secondary | ICD-10-CM

## 2023-12-14 DIAGNOSIS — E782 Mixed hyperlipidemia: Secondary | ICD-10-CM | POA: Diagnosis not present

## 2023-12-14 DIAGNOSIS — E559 Vitamin D deficiency, unspecified: Secondary | ICD-10-CM | POA: Diagnosis not present

## 2023-12-14 DIAGNOSIS — M47816 Spondylosis without myelopathy or radiculopathy, lumbar region: Secondary | ICD-10-CM | POA: Diagnosis not present

## 2023-12-14 DIAGNOSIS — M2550 Pain in unspecified joint: Secondary | ICD-10-CM | POA: Diagnosis not present

## 2023-12-14 DIAGNOSIS — M19042 Primary osteoarthritis, left hand: Secondary | ICD-10-CM

## 2023-12-14 NOTE — Patient Instructions (Addendum)
 Low Back Sprain or Strain Rehab Ask your health care provider which exercises are safe for you. Do exercises exactly as told by your health care provider and adjust them as directed. It is normal to feel mild stretching, pulling, tightness, or discomfort as you do these exercises. Stop right away if you feel sudden pain or your pain gets worse. Do not begin these exercises until told by your health care provider. Stretching and range-of-motion exercises These exercises warm up your muscles and joints and improve the movement and flexibility of your back. These exercises also help to relieve pain, numbness, and tingling. Lumbar rotation  Lie on your back on a firm bed or the floor with your knees bent. Straighten your arms out to your sides so each arm forms a 90-degree angle (right angle) with a side of your body. Slowly move (rotate) both of your knees to one side of your body until you feel a stretch in your lower back (lumbar). Try not to let your shoulders lift off the floor. Hold this position for __________ seconds. Tense your abdominal muscles and slowly move your knees back to the starting position. Repeat this exercise on the other side of your body. Repeat __________ times. Complete this exercise __________ times a day. Single knee to chest  Lie on your back on a firm bed or the floor with both legs straight. Bend one of your knees. Use your hands to move your knee up toward your chest until you feel a gentle stretch in your lower back and buttock. Hold your leg in this position by holding on to the front of your knee. Keep your other leg as straight as possible. Hold this position for __________ seconds. Slowly return to the starting position. Repeat with your other leg. Repeat __________ times. Complete this exercise __________ times a day. Prone extension on elbows  Lie on your abdomen on a firm bed or the floor (prone position). Prop yourself up on your elbows. Use your arms  to help lift your chest up until you feel a gentle stretch in your abdomen and your lower back. This will place some of your body weight on your elbows. If this is uncomfortable, try stacking pillows under your chest. Your hips should stay down, against the surface that you are lying on. Keep your hip and back muscles relaxed. Hold this position for __________ seconds. Slowly relax your upper body and return to the starting position. Repeat __________ times. Complete this exercise __________ times a day. Strengthening exercises These exercises build strength and endurance in your back. Endurance is the ability to use your muscles for a long time, even after they get tired. Pelvic tilt This exercise strengthens the muscles that lie deep in the abdomen. Lie on your back on a firm bed or the floor with your legs extended. Bend your knees so they are pointing toward the ceiling and your feet are flat on the floor. Tighten your lower abdominal muscles to press your lower back against the floor. This motion will tilt your pelvis so your tailbone points up toward the ceiling instead of pointing to your feet or the floor. To help with this exercise, you may place a small towel under your lower back and try to push your back into the towel. Hold this position for __________ seconds. Let your muscles relax completely before you repeat this exercise. Repeat __________ times. Complete this exercise __________ times a day. Alternating arm and leg raises  Get on your hands  and knees on a firm surface. If you are on a hard floor, you may want to use padding, such as an exercise mat, to cushion your knees. Line up your arms and legs. Your hands should be directly below your shoulders, and your knees should be directly below your hips. Lift your left leg behind you. At the same time, raise your right arm and straighten it in front of you. Do not lift your leg higher than your hip. Do not lift your arm higher  than your shoulder. Keep your abdominal and back muscles tight. Keep your hips facing the ground. Do not arch your back. Keep your balance carefully, and do not hold your breath. Hold this position for __________ seconds. Slowly return to the starting position. Repeat with your right leg and your left arm. Repeat __________ times. Complete this exercise __________ times a day. Abdominal set with straight leg raise  Lie on your back on a firm bed or the floor. Bend one of your knees and keep your other leg straight. Tense your abdominal muscles and lift your straight leg up, 4-6 inches (10-15 cm) off the ground. Keep your abdominal muscles tight and hold this position for __________ seconds. Do not hold your breath. Do not arch your back. Keep it flat against the ground. Keep your abdominal muscles tense as you slowly lower your leg back to the starting position. Repeat with your other leg. Repeat __________ times. Complete this exercise __________ times a day. Single leg lower with bent knees Lie on your back on a firm bed or the floor. Tense your abdominal muscles and lift your feet off the floor, one foot at a time, so your knees and hips are bent in 90-degree angles (right angles). Your knees should be over your hips and your lower legs should be parallel to the floor. Keeping your abdominal muscles tense and your knee bent, slowly lower one of your legs so your toe touches the ground. Lift your leg back up to return to the starting position. Do not hold your breath. Do not let your back arch. Keep your back flat against the ground. Repeat with your other leg. Repeat __________ times. Complete this exercise __________ times a day. Posture and body mechanics Good posture and healthy body mechanics can help to relieve stress in your body's tissues and joints. Body mechanics refers to the movements and positions of your body while you do your daily activities. Posture is part of body  mechanics. Good posture means: Your spine is in its natural S-curve position (neutral). Your shoulders are pulled back slightly. Your head is not tipped forward (neutral). Follow these guidelines to improve your posture and body mechanics in your everyday activities. Standing  When standing, keep your spine neutral and your feet about hip-width apart. Keep a slight bend in your knees. Your ears, shoulders, and hips should line up. When you do a task in which you stand in one place for a long time, place one foot up on a stable object that is 2-4 inches (5-10 cm) high, such as a footstool. This helps keep your spine neutral. Sitting  When sitting, keep your spine neutral and keep your feet flat on the floor. Use a footrest, if necessary, and keep your thighs parallel to the floor. Avoid rounding your shoulders, and avoid tilting your head forward. When working at a desk or a computer, keep your desk at a height where your hands are slightly lower than your elbows. Slide your  chair under your desk so you are close enough to maintain good posture. When working at a computer, place your monitor at a height where you are looking straight ahead and you do not have to tilt your head forward or downward to look at the screen. Resting When lying down and resting, avoid positions that are most painful for you. If you have pain with activities such as sitting, bending, stooping, or squatting, lie in a position in which your body does not bend very much. For example, avoid curling up on your side with your arms and knees near your chest (fetal position). If you have pain with activities such as standing for a long time or reaching with your arms, lie with your spine in a neutral position and bend your knees slightly. Try the following positions: Lying on your side with a pillow between your knees. Lying on your back with a pillow under your knees. Lifting  When lifting objects, keep your feet at least  shoulder-width apart and tighten your abdominal muscles. Bend your knees and hips and keep your spine neutral. It is important to lift using the strength of your legs, not your back. Do not lock your knees straight out. Always ask for help to lift heavy or awkward objects. This information is not intended to replace advice given to you by your health care provider. Make sure you discuss any questions you have with your health care provider. Document Revised: 05/18/2022 Document Reviewed: 04/01/2020 [employer] Patient Education  2024 [employer] Exercises for Chronic Knee Pain Chronic knee pain is pain that lasts longer than 3 months. For most people with chronic knee pain, exercise and weight loss is an important part of treatment. Your health care provider may want you to focus on: Making the muscles that support your knee stronger. This can take pressure off your knee and reduce pain. Preventing knee stiffness. How far you can move your knee, keeping it there or making it farther. Losing weight (if this applies) to take pressure off your knee, lower your risk for injury, and make it easier for you to exercise. Your provider will help you make an exercise program that fits your needs and physical abilities. Below are simple, low-impact exercises you can do at home. Ask your provider or physical therapist how often you should do your exercise program and how many times to repeat each exercise. General safety tips  Get your provider's approval before doing any exercises. Start slowly and stop any time you feel pain. Do not exercise if your knee pain is flaring up. Warm up first. Stretching a cold muscle can cause an injury. Do 5-10 minutes of easy movement or light stretching before beginning your exercises. Do 5-10 minutes of low-impact activity (like walking or cycling) before starting strengthening exercises. Contact your provider any time you have pain during or after exercising. Exercise can  cause discomfort but should not be painful. It is normal to be a little stiff or sore after exercising. Stretching and range-of-motion exercises Front thigh stretch  Stand up straight and support your body by holding on to a chair or resting one hand on a wall. With your legs straight and close together, bend one knee to lift your heel up toward your butt. Using one hand for support, grab your ankle with your free hand. Pull your foot up closer toward your butt to feel the stretch in front of your thigh. Hold the stretch for 30 seconds. Repeat __________ times. Complete this  exercise __________ times a day. Back thigh stretch  Sit on the floor with your back straight and your legs out straight in front of you. Place the palms of your hands on the floor and slide them toward your feet as you bend at the hip. Try to touch your nose to your knees and feel the stretch in the back of your thighs. Hold for 30 seconds. Repeat __________ times. Complete this exercise __________ times a day. Calf stretch  Stand facing a wall. Place the palms of your hands flat against the wall, arms extended, and lean slightly against the wall. Get into a lunge position with one leg bent at the knee and the other leg stretched out straight behind you. Keep both feet facing the wall and increase the bend in your knee while keeping the heel of the other leg flat on the ground. You should feel the stretch in your calf. Hold for 30 seconds. Repeat __________ times. Complete this exercise __________ times a day. Strengthening exercises Straight leg lift  Lie on your back with one knee bent and the other leg out straight. Slowly lift the straight leg without bending the knee. Lift until your foot is about 12 inches (30 cm) off the floor. Hold for 3-5 seconds and slowly lower your leg. Repeat __________ times. Complete this exercise __________ times a day. Single leg dip  Stand between two chairs and put both  hands on the backs of the chairs for support. Extend one leg out straight with your body weight resting on the heel of the standing leg. Slowly bend your standing knee to dip your body to the level that is comfortable for you. Hold for 3-5 seconds. Repeat __________ times. Complete this exercise __________ times a day. Hamstring curls  Stand straight, knees close together, facing the back of a chair. Hold on to the back of a chair with both hands. Keep one leg straight. Bend the other knee while bringing the heel up toward the butt until the knee is bent at a 90-degree angle (right angle). Hold for 3-5 seconds. Repeat __________ times. Complete this exercise __________ times a day. Wall squat  Stand straight with your back, hips, and head against a wall. Step forward one foot at a time with your back still against the wall. Your feet should be 2 feet (61 cm) from the wall at shoulder width. Keeping your back, hips, and head against the wall, slide down the wall to as close to a sitting position as you can get. Hold for 5-10 seconds, then slowly slide back up. Repeat __________ times. Complete this exercise __________ times a day. Step-ups  Stand in front of a sturdy platform or stool that is about 6 inches (15 cm) high. Slowly step up with your left / right foot, keeping your knee in line with your hip and foot. Do not let your knee bend so far that you cannot see your toes. Hold on to a chair for balance, but do not use it for support. Slowly unlock your knee and lower yourself to the starting position. Repeat __________ times. Complete this exercise __________ times a day. Contact a health care provider if: Your exercises cause pain. Your pain is worse after you exercise. Your pain prevents you from doing your exercises. This information is not intended to replace advice given to you by your health care provider. Make sure you discuss any questions you have with your health care  provider. Document Revised: 01/27/2022 Document Reviewed:  01/27/2022 [employer] Patient Education  2024 [employer] Hand Exercises Hand exercises can be helpful for almost anyone. They can strengthen your hands and improve flexibility and movement. The exercises can also increase blood flow to the hands. These results can make your work and daily tasks easier for you. Hand exercises can be especially helpful for people who have joint pain from arthritis or nerve damage from using their hands over and over. These exercises can also help people who injure a hand. Exercises Most of these hand exercises are gentle stretching and motion exercises. It is usually safe to do them often throughout the day. Warming up your hands before exercise may help reduce stiffness. You can do this with gentle massage or by placing your hands in warm water for 10-15 minutes. It is normal to feel some stretching, pulling, tightness, or mild discomfort when you begin new exercises. In time, this will improve. Remember to always be careful and stop right away if you feel sudden, very bad pain or your pain gets worse. You want to get better and be safe. Ask your health care provider which exercises are safe for you. Do exercises exactly as told by your provider and adjust them as told. Do not begin these exercises until told by your provider. Knuckle bend or "claw" fist  Stand or sit with your arm, hand, and all five fingers pointed straight up. Make sure to keep your wrist straight. Gently bend your fingers down toward your palm until the tips of your fingers are touching your palm. Keep your big knuckle straight and only bend the small knuckles in your fingers. Hold this position for 10 seconds. Straighten your fingers back to your starting position. Repeat this exercise 5-10 times with each hand. Full finger fist  Stand or sit with your arm, hand, and all five fingers pointed straight up. Make sure to keep your wrist  straight. Gently bend your fingers into your palm until the tips of your fingers are touching the middle of your palm. Hold this position for 10 seconds. Extend your fingers back to your starting position, stretching every joint fully. Repeat this exercise 5-10 times with each hand. Straight fist  Stand or sit with your arm, hand, and all five fingers pointed straight up. Make sure to keep your wrist straight. Gently bend your fingers at the big knuckle, where your fingers meet your hand, and at the middle knuckle. Keep the knuckle at the tips of your fingers straight and try to touch the bottom of your palm. Hold this position for 10 seconds. Extend your fingers back to your starting position, stretching every joint fully. Repeat this exercise 5-10 times with each hand. Tabletop  Stand or sit with your arm, hand, and all five fingers pointed straight up. Make sure to keep your wrist straight. Gently bend your fingers at the big knuckle, where your fingers meet your hand, as far down as you can. Keep the small knuckles in your fingers straight. Think of forming a tabletop with your fingers. Hold this position for 10 seconds. Extend your fingers back to your starting position, stretching every joint fully. Repeat this exercise 5-10 times with each hand. Finger spread  Place your hand flat on a table with your palm facing down. Make sure your wrist stays straight. Spread your fingers and thumb apart from each other as far as you can until you feel a gentle stretch. Hold this position for 10 seconds. Bring your fingers and thumb tight together  again. Hold this position for 10 seconds. Repeat this exercise 5-10 times with each hand. Making circles  Stand or sit with your arm, hand, and all five fingers pointed straight up. Make sure to keep your wrist straight. Make a circle by touching the tip of your thumb to the tip of your index finger. Hold for 10 seconds. Then open your hand  wide. Repeat this motion with your thumb and each of your fingers. Repeat this exercise 5-10 times with each hand. Thumb motion  Sit with your forearm resting on a table and your wrist straight. Your thumb should be facing up toward the ceiling. Keep your fingers relaxed as you move your thumb. Lift your thumb up as high as you can toward the ceiling. Hold for 10 seconds. Bend your thumb across your palm as far as you can, reaching the tip of your thumb for the small finger (pinkie) side of your palm. Hold for 10 seconds. Repeat this exercise 5-10 times with each hand. Grip strengthening  Hold a stress ball or other soft ball in the middle of your hand. Slowly increase the pressure, squeezing the ball as much as you can without causing pain. Think of bringing the tips of your fingers into the middle of your palm. All of your finger joints should bend when doing this exercise. Hold your squeeze for 10 seconds, then relax. Repeat this exercise 5-10 times with each hand. Contact a health care provider if: Your hand pain or discomfort gets much worse when you do an exercise. Your hand pain or discomfort does not improve within 2 hours after you exercise. If you have either of these problems, stop doing these exercises right away. Do not do them again unless your provider says that you can. Get help right away if: You develop sudden, severe hand pain or swelling. If this happens, stop doing these exercises right away. Do not do them again unless your provider says that you can. This information is not intended to replace advice given to you by your health care provider. Make sure you discuss any questions you have with your health care provider. Document Revised: 01/27/2022 Document Reviewed: 01/27/2022 Elsevier Patient Education  2024 [employer].

## 2024-01-05 ENCOUNTER — Telehealth: Payer: Self-pay | Admitting: Physical Therapy

## 2024-01-05 ENCOUNTER — Ambulatory Visit: Attending: Rheumatology | Admitting: Physical Therapy

## 2024-01-05 NOTE — Telephone Encounter (Signed)
 Called to see if she wanted to reschedule no show eval; no answer left vm

## 2024-01-13 ENCOUNTER — Ambulatory Visit: Admitting: Rheumatology

## 2024-03-18 IMAGING — NM NM BONE SCAN 3 PHASE LOWER EXTREMITY
8 series · 18 of 18 positions shown · non-contrast
Comparison: Radiograph 04/12/2021 RIGHT knee

CLINICAL DATA: RIGHT knee replacement 5777. Multiple falls. RIGHT
knee pain

EXAM:
NUCLEAR MEDICINE 3-PHASE BONE SCAN
TECHNIQUE: Radionuclide angiographic images, immediate static blood pool
images, and 3-hour delayed static images were obtained of the knees
after intravenous injection of radiopharmaceutical.
RADIOPHARMACEUTICALS:  Nineteen point mCi [id number] MDP IV

[Series 1: flow · 2.07mm/px · 6 of 15 frames shown (1 of 2)]
[frame 2/15]
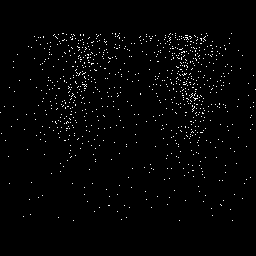
[frame 4/15]
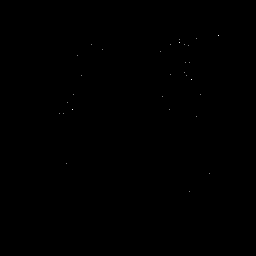
[frame 7/15]
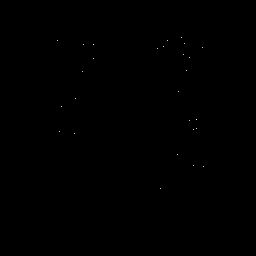
[frame 9/15]
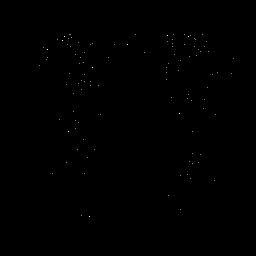
[frame 12/15]
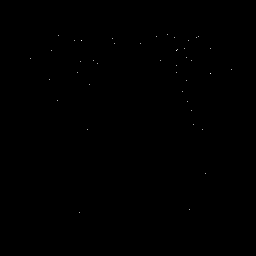
[frame 14/15]
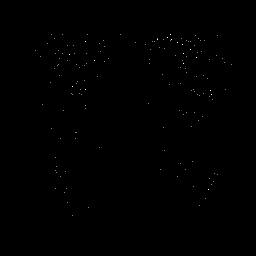

[Series 1: flow · 2.07mm/px · 6 of 15 frames shown (2 of 2)]
[frame 2/15]
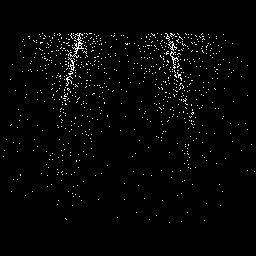
[frame 4/15]
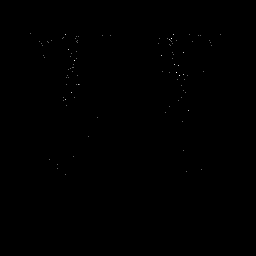
[frame 7/15]
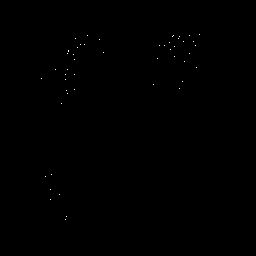
[frame 9/15]
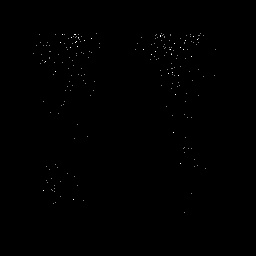
[frame 12/15]
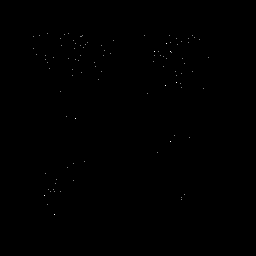
[frame 14/15]
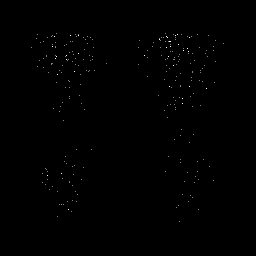

[Series 2: blood pool · 2.07mm/px · 1 of 1 slices shown (1 of 2)]
[im 1/1  full-range]
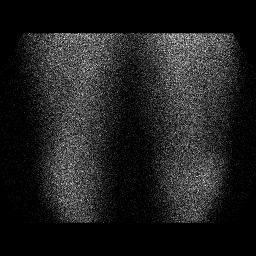

[Series 2: blood pool · 2.07mm/px · 1 of 1 slices shown (2 of 2)]
[im 1/1  full-range]
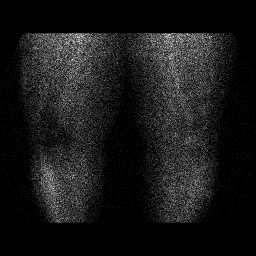

[Series 3: delay · delayed · 2.07mm/px · 1 of 1 slices shown (1 of 4)]
[im 1/1  full-range]
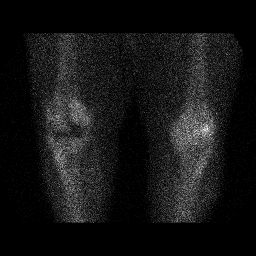

[Series 3: delay · delayed · 2.07mm/px · 1 of 1 slices shown (2 of 4)]
[im 1/1  full-range]
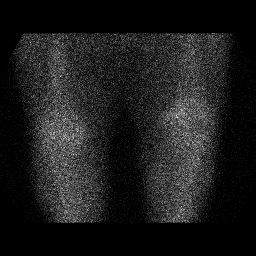

[Series 4: delay · delayed · 2.07mm/px · 1 of 1 slices shown (3 of 4)]
[im 1/1  full-range]
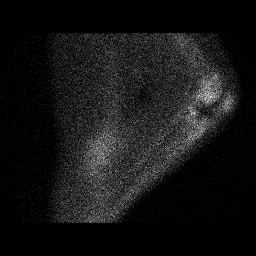

[Series 4: delay · delayed · 2.07mm/px · 1 of 1 slices shown (4 of 4)]
[im 1/1  full-range]
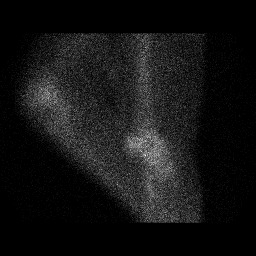

[18 of 18 positions shown; findings below may reference images not displayed]

FINDINGS: Vascular phase: No asymmetric or increased blood flow to the LEFT or
RIGHT knee.

Blood pool phase: No asymmetric or increased blood pool activity
LEFT or RIGHT knee.

Delayed phase: Photopenia noted at the RIGHT knee prosthetic. No
significant delayed uptake in the RIGHT knee. Mild degenerative
uptake LEFT knee.
IMPRESSION: No evidence of loosening or infection of the RIGHT knee prosthetic
